# Patient Record
Sex: Male | Born: 1950
Health system: Southern US, Community
[De-identification: ages and names within clinical notes are randomized; demographics above are authoritative.]

## PROBLEM LIST (undated history)

## (undated) DIAGNOSIS — E119 Type 2 diabetes mellitus without complications: Secondary | ICD-10-CM

## (undated) DIAGNOSIS — I10 Essential (primary) hypertension: Secondary | ICD-10-CM

## (undated) DIAGNOSIS — I471 Supraventricular tachycardia, unspecified: Secondary | ICD-10-CM

## (undated) HISTORY — PX: NO PAST SURGERIES: SHX2092

## (undated) HISTORY — DX: Supraventricular tachycardia, unspecified: I47.10

## (undated) HISTORY — DX: Supraventricular tachycardia: I47.1

---

## 2009-12-19 ENCOUNTER — Emergency Department (HOSPITAL_COMMUNITY)
Admission: EM | Admit: 2009-12-19 | Discharge: 2009-12-19 | Payer: Self-pay | Source: Home / Self Care | Admitting: Family Medicine

## 2015-04-19 MED FILL — PIOGLITAZONE HCL 30 MG TAB: 30 | 90 days supply | Qty: 90 | Fill #1

## 2015-04-19 MED FILL — LOVASTATIN 40 MG TABLET: 40 | 90 days supply | Qty: 90 | Fill #1

## 2015-04-23 MED FILL — CARVEDILOL 12.5 MG TABLET: 12.5 | 90 days supply | Qty: 180 | Fill #1

## 2015-05-15 DIAGNOSIS — E669 Obesity, unspecified: Secondary | ICD-10-CM | POA: Diagnosis not present

## 2015-05-15 DIAGNOSIS — I10 Essential (primary) hypertension: Secondary | ICD-10-CM | POA: Diagnosis not present

## 2015-05-15 DIAGNOSIS — E782 Mixed hyperlipidemia: Secondary | ICD-10-CM | POA: Diagnosis not present

## 2015-05-15 DIAGNOSIS — Z6838 Body mass index (BMI) 38.0-38.9, adult: Secondary | ICD-10-CM | POA: Diagnosis not present

## 2015-05-15 DIAGNOSIS — E119 Type 2 diabetes mellitus without complications: Secondary | ICD-10-CM | POA: Diagnosis not present

## 2015-05-15 DIAGNOSIS — Z1211 Encounter for screening for malignant neoplasm of colon: Secondary | ICD-10-CM | POA: Diagnosis not present

## 2015-05-16 MED FILL — PIOGLITAZONE HCL 45 MG TAB: 45 | 90 days supply | Qty: 90 | Fill #0

## 2015-06-06 MED FILL — BENAZEPRIL-HCTZ 20-25 MG TA: 20-25 | 90 days supply | Qty: 90 | Fill #2

## 2015-06-13 MED FILL — JANUMET 50-1,000 MG TABLET: 50-1000 | 90 days supply | Qty: 180 | Fill #2

## 2015-07-23 MED FILL — LOVASTATIN 40 MG TABLET: 40 | 90 days supply | Qty: 90 | Fill #0

## 2015-07-23 MED FILL — CARVEDILOL 12.5 MG TABLET: 12.5 | 90 days supply | Qty: 180 | Fill #0

## 2015-09-04 MED FILL — BENAZEPRIL-HCTZ 20-25 MG TA: 20-25 | 90 days supply | Qty: 90 | Fill #0

## 2015-09-13 MED FILL — JANUMET 50-1,000 MG TABLET: 50-1000 | 90 days supply | Qty: 180 | Fill #0

## 2015-10-10 MED FILL — PIOGLITAZONE HCL 45 MG TAB: 45 | 90 days supply | Qty: 90 | Fill #0

## 2015-10-22 MED FILL — CARVEDILOL 12.5 MG TABLET: 12.5 | 90 days supply | Qty: 180 | Fill #1

## 2015-10-22 MED FILL — LOVASTATIN 40 MG TABLET: 40 | 90 days supply | Qty: 90 | Fill #1

## 2015-11-15 DIAGNOSIS — E1165 Type 2 diabetes mellitus with hyperglycemia: Secondary | ICD-10-CM | POA: Diagnosis not present

## 2015-11-15 DIAGNOSIS — I10 Essential (primary) hypertension: Secondary | ICD-10-CM | POA: Diagnosis not present

## 2015-11-15 DIAGNOSIS — L309 Dermatitis, unspecified: Secondary | ICD-10-CM | POA: Diagnosis not present

## 2015-11-15 DIAGNOSIS — E782 Mixed hyperlipidemia: Secondary | ICD-10-CM | POA: Diagnosis not present

## 2015-11-15 MED FILL — TRIAMCINOLONE 0.1% CREAM: 0.1 | 10 days supply | Qty: 30 | Fill #0

## 2015-11-23 MED FILL — GLIMEPIRIDE 2 MG TABLET: 2 | 30 days supply | Qty: 60 | Fill #0

## 2015-12-04 MED FILL — BENAZEPRIL-HCTZ 20-25 MG TA: 20-25 | 90 days supply | Qty: 90 | Fill #1

## 2015-12-12 MED FILL — JANUMET 50-1,000 MG TABLET: 50-1000 | 90 days supply | Qty: 180 | Fill #1

## 2015-12-17 MED FILL — GLIMEPIRIDE 2 MG TABLET: 2 | 30 days supply | Qty: 120 | Fill #0

## 2016-01-16 MED FILL — LOVASTATIN 40 MG TABLET: 40 | 90 days supply | Qty: 90 | Fill #0

## 2016-01-16 MED FILL — GLIMEPIRIDE 2 MG TABLET: 2 | 30 days supply | Qty: 120 | Fill #1

## 2016-01-16 MED FILL — CARVEDILOL 12.5 MG TABLET: 12.5 | 90 days supply | Qty: 180 | Fill #0

## 2016-02-14 MED FILL — GLIMEPIRIDE 2 MG TABLET: 2 | 30 days supply | Qty: 120 | Fill #0

## 2016-02-26 MED FILL — BENAZEPRIL-HCTZ 20-25 MG TA: 20-25 | 90 days supply | Qty: 90 | Fill #0

## 2016-02-26 MED FILL — TRIAMCINOLONE 0.1% CREAM: 0.1 | 10 days supply | Qty: 30 | Fill #1

## 2016-03-07 DIAGNOSIS — I10 Essential (primary) hypertension: Secondary | ICD-10-CM | POA: Diagnosis not present

## 2016-03-07 DIAGNOSIS — E782 Mixed hyperlipidemia: Secondary | ICD-10-CM | POA: Diagnosis not present

## 2016-03-07 DIAGNOSIS — E119 Type 2 diabetes mellitus without complications: Secondary | ICD-10-CM | POA: Diagnosis not present

## 2016-03-07 MED FILL — PIOGLITAZONE HCL 15 MG TAB: 15 | 90 days supply | Qty: 90 | Fill #0

## 2016-03-07 MED FILL — JANUMET 50-1,000 MG TABLET: 50-1000 | 90 days supply | Qty: 180 | Fill #0

## 2016-03-07 MED FILL — GLIMEPIRIDE 2 MG TABLET: 2 | 90 days supply | Qty: 360 | Fill #0

## 2016-04-21 MED FILL — LOVASTATIN 40 MG TABLET: 40 | 90 days supply | Qty: 90 | Fill #0

## 2016-04-21 MED FILL — CARVEDILOL 12.5 MG TABLET: 12.5 | 90 days supply | Qty: 180 | Fill #0

## 2016-05-25 ENCOUNTER — Observation Stay (HOSPITAL_COMMUNITY): Payer: 59

## 2016-05-25 ENCOUNTER — Encounter (HOSPITAL_COMMUNITY): Payer: Self-pay | Admitting: Family Medicine

## 2016-05-25 ENCOUNTER — Emergency Department (HOSPITAL_COMMUNITY): Payer: 59

## 2016-05-25 ENCOUNTER — Observation Stay (HOSPITAL_COMMUNITY)
Admission: EM | Admit: 2016-05-25 | Discharge: 2016-05-27 | Disposition: A | Discharge status: Home / Self Care | Payer: 59 | Attending: Internal Medicine | Admitting: Internal Medicine

## 2016-05-25 DIAGNOSIS — E119 Type 2 diabetes mellitus without complications: Secondary | ICD-10-CM | POA: Diagnosis not present

## 2016-05-25 DIAGNOSIS — R058 Other specified cough: Secondary | ICD-10-CM | POA: Diagnosis present

## 2016-05-25 DIAGNOSIS — Z6837 Body mass index (BMI) 37.0-37.9, adult: Secondary | ICD-10-CM | POA: Diagnosis not present

## 2016-05-25 DIAGNOSIS — E785 Hyperlipidemia, unspecified: Secondary | ICD-10-CM | POA: Insufficient documentation

## 2016-05-25 DIAGNOSIS — I471 Supraventricular tachycardia: Secondary | ICD-10-CM | POA: Insufficient documentation

## 2016-05-25 DIAGNOSIS — R748 Abnormal levels of other serum enzymes: Secondary | ICD-10-CM | POA: Diagnosis not present

## 2016-05-25 DIAGNOSIS — I472 Ventricular tachycardia: Secondary | ICD-10-CM | POA: Diagnosis not present

## 2016-05-25 DIAGNOSIS — I214 Non-ST elevation (NSTEMI) myocardial infarction: Principal | ICD-10-CM

## 2016-05-25 DIAGNOSIS — I480 Paroxysmal atrial fibrillation: Secondary | ICD-10-CM | POA: Diagnosis not present

## 2016-05-25 DIAGNOSIS — Z7984 Long term (current) use of oral hypoglycemic drugs: Secondary | ICD-10-CM | POA: Insufficient documentation

## 2016-05-25 DIAGNOSIS — E1165 Type 2 diabetes mellitus with hyperglycemia: Secondary | ICD-10-CM

## 2016-05-25 DIAGNOSIS — N2889 Other specified disorders of kidney and ureter: Secondary | ICD-10-CM | POA: Diagnosis not present

## 2016-05-25 DIAGNOSIS — I10 Essential (primary) hypertension: Secondary | ICD-10-CM | POA: Diagnosis present

## 2016-05-25 DIAGNOSIS — R05 Cough: Secondary | ICD-10-CM | POA: Insufficient documentation

## 2016-05-25 DIAGNOSIS — I25110 Atherosclerotic heart disease of native coronary artery with unstable angina pectoris: Secondary | ICD-10-CM | POA: Diagnosis not present

## 2016-05-25 DIAGNOSIS — I499 Cardiac arrhythmia, unspecified: Secondary | ICD-10-CM | POA: Diagnosis not present

## 2016-05-25 DIAGNOSIS — I700 Atherosclerosis of aorta: Secondary | ICD-10-CM | POA: Insufficient documentation

## 2016-05-25 DIAGNOSIS — E669 Obesity, unspecified: Secondary | ICD-10-CM | POA: Insufficient documentation

## 2016-05-25 DIAGNOSIS — R0602 Shortness of breath: Secondary | ICD-10-CM | POA: Diagnosis not present

## 2016-05-25 HISTORY — DX: Essential (primary) hypertension: I10

## 2016-05-25 HISTORY — DX: Type 2 diabetes mellitus without complications: E11.9

## 2016-05-25 LAB — CBC
HCT: 43.5 % (ref 39.0–52.0)
HEMATOCRIT: 48.1 % (ref 39.0–52.0)
HEMOGLOBIN: 16.5 g/dL (ref 13.0–17.0)
Hemoglobin: 15.3 g/dL (ref 13.0–17.0)
MCH: 31.4 pg (ref 26.0–34.0)
MCH: 31.9 pg (ref 26.0–34.0)
MCHC: 34.3 g/dL (ref 30.0–36.0)
MCHC: 35.2 g/dL (ref 30.0–36.0)
MCV: 91.6 fL (ref 78.0–100.0)
MCV: 90.8 fL (ref 78.0–100.0)
PLATELETS: 117 10*3/uL — AB (ref 150–400)
Platelets: 190 10*3/uL (ref 150–400)
RBC: 5.25 MIL/uL (ref 4.22–5.81)
RBC: 4.79 MIL/uL (ref 4.22–5.81)
RDW: 13.6 % (ref 11.5–15.5)
RDW: 13.7 % (ref 11.5–15.5)
WBC: 14.4 10*3/uL — ABNORMAL HIGH (ref 4.0–10.5)
WBC: 6.9 10*3/uL (ref 4.0–10.5)

## 2016-05-25 LAB — TROPONIN I
TROPONIN I: 0.86 ng/mL — AB (ref <0.03)
TROPONIN I: 4.52 ng/mL — AB (ref <0.03)
Troponin I: 2.64 ng/mL (ref <0.03)

## 2016-05-25 LAB — EXPECTORATED SPUTUM ASSESSMENT W REFEX TO RESP CULTURE

## 2016-05-25 LAB — BASIC METABOLIC PANEL
ANION GAP: 10 (ref 5–15)
BUN: 22 mg/dL — ABNORMAL HIGH (ref 6–20)
CHLORIDE: 103 mmol/L (ref 101–111)
CO2: 24 mmol/L (ref 22–32)
Calcium: 10.1 mg/dL (ref 8.9–10.3)
Creatinine, Ser: 1.29 mg/dL — ABNORMAL HIGH (ref 0.61–1.24)
GFR calc non Af Amer: 57 mL/min — ABNORMAL LOW (ref >60)
GLUCOSE: 277 mg/dL — AB (ref 65–99)
Potassium: 4.2 mmol/L (ref 3.5–5.1)
Sodium: 137 mmol/L (ref 135–145)

## 2016-05-25 LAB — PROTIME-INR
INR: 1.03
Prothrombin Time: 13.5 seconds (ref 11.4–15.2)

## 2016-05-25 LAB — GLUCOSE, CAPILLARY
Glucose-Capillary: 208 mg/dL — ABNORMAL HIGH (ref 65–99)
Glucose-Capillary: 161 mg/dL — ABNORMAL HIGH (ref 65–99)
Glucose-Capillary: 102 mg/dL — ABNORMAL HIGH (ref 65–99)

## 2016-05-25 LAB — CREATININE, SERUM
CREATININE: 1.14 mg/dL (ref 0.61–1.24)
GFR calc Af Amer: >60 mL/min (ref >60)

## 2016-05-25 LAB — I-STAT TROPONIN, ED: TROPONIN I, POC: 0 ng/mL (ref 0.00–0.08)

## 2016-05-25 LAB — PHOSPHORUS: Phosphorus: 3 mg/dL (ref 2.5–4.6)

## 2016-05-25 LAB — HEPARIN LEVEL (UNFRACTIONATED): Heparin Unfractionated: 0.22 IU/mL — ABNORMAL LOW (ref 0.30–0.70)

## 2016-05-25 LAB — MAGNESIUM: Magnesium: 1.7 mg/dL (ref 1.7–2.4)

## 2016-05-25 LAB — EXPECTORATED SPUTUM ASSESSMENT W GRAM STAIN, RFLX TO RESP C: Special Requests: NORMAL

## 2016-05-25 MED ORDER — METOPROLOL TARTRATE 5 MG/5ML IV SOLN
5.0000 mg | Freq: Once | INTRAVENOUS | Status: AC
  Administered 2016-05-25: 5 mg via INTRAVENOUS
  Filled 2016-05-25: qty 5

## 2016-05-25 MED ORDER — ASPIRIN EC 325 MG PO TBEC
325.0000 mg | DELAYED_RELEASE_TABLET | Freq: Every day | ORAL | Status: DC
  Administered 2016-05-25: 325 mg via ORAL
  Filled 2016-05-25: qty 1

## 2016-05-25 MED ORDER — DEXTROSE 5 % IV SOLN
1.0000 g | INTRAVENOUS | Status: DC
  Administered 2016-05-25 – 2016-05-27 (×3): 1 g via INTRAVENOUS
  Filled 2016-05-25 (×3): qty 10

## 2016-05-25 MED ORDER — ZOLPIDEM TARTRATE 5 MG PO TABS: 5.0000 mg | ORAL_TABLET | Freq: Every evening | ORAL | Status: DC | PRN

## 2016-05-25 MED ORDER — SODIUM CHLORIDE 0.9 % IV BOLUS (SEPSIS)
1000.0000 mL | Freq: Once | INTRAVENOUS | Status: AC
  Administered 2016-05-25: 1000 mL via INTRAVENOUS

## 2016-05-25 MED ORDER — HEPARIN SODIUM (PORCINE) 5000 UNIT/ML IJ SOLN: 5000.0000 [IU] | Freq: Three times a day (TID) | INTRAMUSCULAR | Status: DC

## 2016-05-25 MED ORDER — HEPARIN (PORCINE) IN NACL 100-0.45 UNIT/ML-% IJ SOLN
1550.0000 [IU]/h | INTRAMUSCULAR | Status: DC
  Administered 2016-05-25: 1350 [IU]/h via INTRAVENOUS
  Administered 2016-05-26: 1550 [IU]/h via INTRAVENOUS
  Filled 2016-05-25 (×2): qty 250

## 2016-05-25 MED ORDER — NITROGLYCERIN 0.4 MG SL SUBL: 0.4000 mg | SUBLINGUAL_TABLET | SUBLINGUAL | Status: DC | PRN

## 2016-05-25 MED ORDER — INSULIN ASPART 100 UNIT/ML ~~LOC~~ SOLN
0.0000 [IU] | Freq: Three times a day (TID) | SUBCUTANEOUS | Status: DC
  Administered 2016-05-25: 7 [IU] via SUBCUTANEOUS
  Administered 2016-05-25 – 2016-05-27 (×6): 4 [IU] via SUBCUTANEOUS

## 2016-05-25 MED ORDER — ADENOSINE 6 MG/2ML IV SOLN
INTRAVENOUS | Status: AC
  Administered 2016-05-25: 12 mg
  Filled 2016-05-25: qty 4

## 2016-05-25 MED ORDER — DILTIAZEM HCL 25 MG/5ML IV SOLN
15.0000 mg | Freq: Once | INTRAVENOUS | Status: DC | PRN
  Filled 2016-05-25: qty 5

## 2016-05-25 MED ORDER — HEPARIN BOLUS VIA INFUSION
4000.0000 [IU] | Freq: Once | INTRAVENOUS | Status: AC
  Administered 2016-05-25: 4000 [IU] via INTRAVENOUS
  Filled 2016-05-25: qty 4000

## 2016-05-25 MED ORDER — ALPRAZOLAM 0.25 MG PO TABS: 0.2500 mg | ORAL_TABLET | Freq: Two times a day (BID) | ORAL | Status: DC | PRN

## 2016-05-25 MED ORDER — MORPHINE SULFATE (PF) 4 MG/ML IV SOLN: 2.0000 mg | INTRAVENOUS | Status: DC | PRN

## 2016-05-25 MED ORDER — ONDANSETRON HCL 4 MG/2ML IJ SOLN: 4.0000 mg | Freq: Four times a day (QID) | INTRAMUSCULAR | Status: DC | PRN

## 2016-05-25 MED ORDER — HYDRALAZINE HCL 20 MG/ML IJ SOLN: 10.0000 mg | Freq: Three times a day (TID) | INTRAMUSCULAR | Status: DC | PRN

## 2016-05-25 MED ORDER — ACETAMINOPHEN 325 MG PO TABS: 650.0000 mg | ORAL_TABLET | ORAL | Status: DC | PRN

## 2016-05-25 MED ORDER — GUAIFENESIN ER 600 MG PO TB12
600.0000 mg | ORAL_TABLET | Freq: Two times a day (BID) | ORAL | Status: DC
  Administered 2016-05-25 – 2016-05-27 (×4): 600 mg via ORAL
  Filled 2016-05-25 (×4): qty 1

## 2016-05-25 MED ORDER — IPRATROPIUM BROMIDE 0.02 % IN SOLN
0.5000 mg | Freq: Four times a day (QID) | RESPIRATORY_TRACT | Status: DC
  Administered 2016-05-25 – 2016-05-26 (×4): 0.5 mg via RESPIRATORY_TRACT
  Filled 2016-05-25 (×4): qty 2.5

## 2016-05-25 NOTE — Progress Notes (Signed)
ANTICOAGULATION CONSULT NOTE - Follow Up Consult  Pharmacy Consult for Heparin Indication: chest pain/ACS  Patient Measurements: Height: 5\' 9"  (175.3 cm) Weight: 256 lb 12.8 oz (116.5 kg) IBW/kg (Calculated) : 70.7 Heparin Dosing Weight: 96.8 kg  Vital Signs: Temp: 97.4 F (36.3 C) (03/18 1656) Temp Source: Oral (03/18 1656) BP: 134/79 (03/18 1656) Pulse Rate: 78 (03/18 1656)  Labs:  Recent Labs  05/25/16 0815 05/25/16 1053 05/25/16 1306 05/25/16 1617 05/25/16 1853  HGB 16.5 15.3  --   --   --   HCT 48.1 43.5  --   --   --   PLT 190 117*  --   --   --   LABPROT 13.5  --   --   --   --   INR 1.03  --   --   --   --   HEPARINUNFRC  --   --   --   --  0.22*  CREATININE 1.29* 1.14  --   --   --   TROPONINI  --  0.86* 2.64* 4.52*  --     Estimated Creatinine Clearance: 83.5 mL/min (by C-G formula based on SCr of 1.14 mg/dL).   Assessment: 30 yof presents with tachycardia, sweating, elevated troponin. Pharmacy consulted to dose heparin for ACS. Not on anticoagulation PTA. Low probability for PE per MD notes.  Heparin level this evening resulted as SUBtherapeutic however was 2 hours early (HL 0.22, goal of 0.3-0.7). Will increase but not aggressively given early level. Hgb/Hct wnl, plts 117 - will watch.   Goal of Therapy:  Heparin level 0.3-0.7 units/ml Monitor platelets by anticoagulation protocol: Yes   Plan:  1. Increase Heparin to 1550 units/hr (15.5 ml/hr) 2. Will continue to monitor for any signs/symptoms of bleeding and will follow up with heparin level in 6 hours   Thank you for allowing pharmacy to be a part of this patient's care.  Alycia Rossetti, PharmD, BCPS Clinical Pharmacist Pager: [phone] 05/25/2016 7:33 PM

## 2016-05-25 NOTE — Progress Notes (Signed)
Notified MD of pt troponin level 4.52. Pt asymptomatic VSS. Will continue to monitor. Isac Caddy, RN

## 2016-05-25 NOTE — H&P (Signed)
[facility] History and Physical  Barry Alexander RSW:[id number] DOB: 1950/08/02 DOA: 05/25/2016  Referring physician:  PCP: Orpah Melter, MD   Chief Complaint: "All of a sudden my heart started racing and I started to poor sweat."  HPI: Barry Alexander is a 66 y.o. male  with past history of diabetes and hypertension presents emergency room with tachycardia and diaphoresis. Patient states that over last 3 weeks he's had a productive cough of white sputum. Hasn't taken any medication for this. Has not really improved. Over this time he also has some shortness of breath. Today he was working with no difficulties. He went up to get a piece of equipment and all of a sudden his heart began racing started to poor sweat. Patient works at [employer] and made his way to emergency room.  ED course: EKG confirmed wide complex tachycardia and patient was given adenosine. This was effective and patient's rate became regular. Cardiology was consulted by the EDP but they declined admission and asked the hospitalist to admit patient. Hospitalist consulted. Initial troponin negative.   Review of Systems:  As per HPI otherwise 10 point review of systems negative.    Past Medical History:  Diagnosis Date  . DM (diabetes mellitus) ([place])   . HTN (hypertension)    Past Surgical History:  Procedure Laterality Date  . NO PAST SURGERIES     Social History:  has no tobacco, alcohol, and drug history on file.  Not on File  Family History  Problem Relation Age of Onset  . Heart attack Neg Hx      Prior to Admission medications   Not on File   Physical Exam: Vitals:   05/25/16 0807 05/25/16 0815 05/25/16 0822  BP: (!) 133/113 (!) 184/165 (!) 134/91  Pulse: (!) 170 (!) 163 92  Resp: (!) 28 (!) 28 20  SpO2: 100% 100% 99%    Wt Readings from Last 3 Encounters:  No data found for Wt    General:  Appears calm and comfortable, Alert and oriented 3 Eyes:  PERRL, EOMI, normal  lids, iris ENT:  grossly normal hearing, lips & tongue Neck:  no LAD, masses or thyromegaly Cardiovascular:  RRR, no m/r/g. No LE edema.  Respiratory:  Mild rhonchi heard diffusely, labored breathing, tachypnea use of accessory muscles, breathing with pursed lips Abdomen:  soft, ntnd Skin:  no rash or induration seen on limited exam Musculoskeletal:  grossly normal tone BUE/BLE Psychiatric:  grossly normal mood and affect, speech fluent and appropriate Neurologic:  CN 2-12 grossly intact, moves all extremities in coordinated fashion.          Labs on Admission:  Basic Metabolic Panel:  Recent Labs Lab 05/25/16 0815  NA 137  K 4.2  CL 103  CO2 24  GLUCOSE 277*  BUN 22*  CREATININE 1.29*  CALCIUM 10.1   Liver Function Tests: No results for input(s): AST, ALT, ALKPHOS, BILITOT, PROT, ALBUMIN in the last 168 hours. No results for input(s): LIPASE, AMYLASE in the last 168 hours. No results for input(s): AMMONIA in the last 168 hours. CBC:  Recent Labs Lab 05/25/16 0815  WBC 14.4*  HGB 16.5  HCT 48.1  MCV 91.6  PLT 190   Cardiac Enzymes: No results for input(s): CKTOTAL, CKMB, CKMBINDEX, TROPONINI in the last 168 hours.  BNP (last 3 results) No results for input(s): BNP in the last 8760 hours.  ProBNP (last 3 results) No results for input(s): PROBNP in the last 8760  hours.   Creatinine clearance cannot be calculated (Unknown ideal weight.)  CBG: No results for input(s): GLUCAP in the last 168 hours.  Radiological Exams on Admission: Dg Chest Portable 1 View  Result Date: 05/25/2016 CLINICAL DATA:  66 year old male with central and left-sided chest pain accompanied by weakness and shortness of breath. EXAM: PORTABLE CHEST 1 VIEW COMPARISON:  None FINDINGS: The lungs are clear and negative for focal airspace consolidation, pulmonary edema or suspicious pulmonary nodule. No pleural effusion or pneumothorax. Cardiac and mediastinal contours are within normal limits.  No acute fracture or lytic or blastic osseous lesions. The visualized upper abdominal bowel gas pattern is unremarkable. IMPRESSION: Negative chest x-ray. Electronically Signed   By: Jacqulynn Cadet M.D.   On: 05/25/2016 08:35    EKG: Independently reviewed. Wide Tachycardia.   Assessment/Plan Principal Problem:   Arrhythmia Active Problems:   DM II (diabetes mellitus, type II), controlled (HCC)   HTN (hypertension), benign   Productive cough  Arrhythmia Resolved after adenosine in the emergency room EDP reports disordered cardiology who requested to do consultation only with hospitalists as primary Post adenosine EKG ordered Checking magnesium phosphorus Serial troponin Echo ordered Well's score for PE 1.5 low risk  Productive cough Due to 3 weeks duration and patient's dyspnea bedside which he says is been going on for the same time Checking chest CT Started on empiric Rocephin Sputum cult ordered  DM SSI ACHS Check A1c  Hypertension When necessary hydralazine 10 mg IV as needed for severe blood pressure  Awaiting med rec  Code Status: FULL DVT Prophylaxis: Heparin Family Communication: pt want to discuss with wife himself Disposition Plan: Pending Improvement  Status: tele obs  Elwin Mocha, MD Family Medicine [facility] [email or web address] Password [email or web address]

## 2016-05-25 NOTE — ED Provider Notes (Signed)
[facility] DEPT Provider Note   CSN: [id number] Arrival date & time: 05/25/16  0800     History   Chief Complaint Chief Complaint  Patient presents with  . Chest Pain    HPI Barry Alexander is a 66 y.o. male.  HPI Patient presents to the emergency department with sudden onset of chest discomfort, jaw discomfort, nausea and diaphoresis with heart racing.  The patient states that it started when he woke up this morning.  He  states he has not had any episodes like this in the past. The patient denies headache,blurred vision, neck pain, fever, cough, weakness, numbness, dizziness, anorexia, edema, abdominal pain,  vomiting, diarrhea, rash, back pain, dysuria, hematemesis, bloody stool, near syncope, or syncope. No past medical history on file.  Patient Active Problem List   Diagnosis Date Noted  . Arrhythmia 05/25/2016    No past surgical history on file.     Home Medications    Prior to Admission medications   Not on File    Family History No family history on file.  Social History Social History  Substance Use Topics  . Smoking status: Not on file  . Smokeless tobacco: Not on file  . Alcohol use Not on file     Allergies   Patient has no allergy information on record.   Review of Systems Review of Systems  All other systems negative except as documented in the HPI. All pertinent positives and negatives as reviewed in the HPI. Physical Exam Updated Vital Signs BP (!) 134/91 (BP Location: Right Arm)   Pulse 92   Resp 20   SpO2 99%   Physical Exam  Constitutional: He is oriented to person, place, and time. He appears well-developed and well-nourished. No distress.  HENT:  Head: Normocephalic and atraumatic.  Mouth/Throat: Oropharynx is clear and moist.  Eyes: Pupils are equal, round, and reactive to light.  Neck: Normal range of motion. Neck supple.  Cardiovascular: Regular rhythm and normal heart sounds.  Tachycardia present.  Exam reveals no  gallop and no friction rub.   No murmur heard. Pulmonary/Chest: Effort normal and breath sounds normal. No respiratory distress. He has no wheezes.  Neurological: He is alert and oriented to person, place, and time. He exhibits normal muscle tone. Coordination normal.  Skin: Skin is warm and dry. Capillary refill takes less than 2 seconds. No rash noted. No erythema.  Psychiatric: He has a normal mood and affect. His behavior is normal.  Nursing note and vitals reviewed.    ED Treatments / Results  Labs (all labs ordered are listed, but only abnormal results are displayed) Labs Reviewed  BASIC METABOLIC PANEL - Abnormal; Notable for the following:       Result Value   Glucose, Bld 277 (*)    BUN 22 (*)    Creatinine, Ser 1.29 (*)    GFR calc non Af Amer 57 (*)    All other components within normal limits  CBC - Abnormal; Notable for the following:    WBC 14.4 (*)    All other components within normal limits  PROTIME-INR  HIV ANTIBODY (ROUTINE TESTING)  TROPONIN I  TROPONIN I  TROPONIN I  CBC  CREATININE, SERUM  MAGNESIUM  PHOSPHORUS  CALCIUM, IONIZED  I-STAT TROPOININ, ED    EKG  EKG Interpretation None       Radiology Dg Chest Portable 1 View  Result Date: 05/25/2016 CLINICAL DATA:  66 year old male with central and left-sided chest pain accompanied by  weakness and shortness of breath. EXAM: PORTABLE CHEST 1 VIEW COMPARISON:  None FINDINGS: The lungs are clear and negative for focal airspace consolidation, pulmonary edema or suspicious pulmonary nodule. No pleural effusion or pneumothorax. Cardiac and mediastinal contours are within normal limits. No acute fracture or lytic or blastic osseous lesions. The visualized upper abdominal bowel gas pattern is unremarkable. IMPRESSION: Negative chest x-ray. Electronically Signed   By: Jacqulynn Cadet M.D.   On: 05/25/2016 08:35    Procedures Procedures (including critical care time)  Medications Ordered in  ED Medications  acetaminophen (TYLENOL) tablet 650 mg (not administered)  ondansetron (ZOFRAN) injection 4 mg (not administered)  heparin injection 5,000 Units (not administered)  morphine 4 MG/ML injection 2 mg (not administered)  aspirin EC tablet 325 mg (not administered)  zolpidem (AMBIEN) tablet 5 mg (not administered)  ALPRAZolam (XANAX) tablet 0.25 mg (not administered)  nitroGLYCERIN (NITROSTAT) SL tablet 0.4 mg (not administered)  hydrALAZINE (APRESOLINE) injection 10 mg (not administered)  adenosine (ADENOCARD) 6 MG/2ML injection (12 mg  Given 05/25/16 0814)  sodium chloride 0.9 % bolus 1,000 mL (1,000 mLs Intravenous New Bag/Given 05/25/16 1001)     Initial Impression / Assessment and Plan / ED Course  I have reviewed the triage vital signs and the nursing notes.  Pertinent labs & imaging results that were available during my care of the patient were reviewed by me and considered in my medical decision making (see chart for details).     I spoke with cardiology about the patient has asked me to speak with the hospitalist about admission.  They will consult.  Patient is advised plan and all questions were answered.   Final Clinical Impressions(s) / ED Diagnoses   Final diagnoses:  SVT (supraventricular tachycardia) [facility])    New Prescriptions New Prescriptions   No medications on file     Dalia Heading, PA-C 05/25/16 [place], MD 05/25/16 1220

## 2016-05-25 NOTE — ED Notes (Signed)
Pt transported to CT ?

## 2016-05-25 NOTE — ED Notes (Signed)
MD Aggie Moats made aware trop 0.86

## 2016-05-25 NOTE — Progress Notes (Signed)
[facility] for heparin Indication: chest pain/ACS  Heparin Dosing Weight: 96.8 kg   Assessment: 74 yof presents with tachycardia, sweating, elevated troponin. Pharmacy consulted to dose heparin for ACS. Not on anticoagulation PTA. Heparin for VTE prophylaxis ordered - not yet given. Hg wnl, plt 117. No bleed documented. Low probability for PE per MD notes.  Goal of Therapy:  Heparin level 0.3-0.7 units/ml Monitor platelets by anticoagulation protocol: Yes   Plan:  D/c heparin SQ Heparin 4000 unit bolus Start heparin at 1350 units/h 6h heparin level Daily heparin level/CBC Monitor s/sx bleeding   Elicia Lamp, PharmD, BCPS Clinical Pharmacist 05/25/2016 12:32 PM

## 2016-05-25 NOTE — Progress Notes (Signed)
Pharmacy Antibiotic Note  Barry Alexander is a 66 y.o. male admitted on 05/25/2016 with tachycardia and sweating. MD concerned for upper respiratory infection/bacterial bronchitis.  Pharmacy has been consulted for ceftriaxone dosing.  Patient has history of DM and HTN but no allergies that he is aware of. He is agreeable to receiving Ceftriaxone.   Plan: Ceftriaxone 1g IV every 24 hours.  No dose adjustment needed, pharmacy will sign off.      No data recorded.   Recent Labs Lab 05/25/16 0815  WBC 14.4*  CREATININE 1.29*    CrCl cannot be calculated (Unknown ideal weight.).    Not on File  Antimicrobials this admission: Ceftriaxone 3/18 >>  Dose adjustments this admission:  Microbiology results: 3/18 Sputum >>  Thank you for allowing pharmacy to be a part of this patient's care.  Sloan Leiter, PharmD, BCPS Clinical Pharmacist Clinical phone [phone] until 3:30 PM 05/25/2016  After hours, please call #[phone] 05/25/2016 11:09 AM

## 2016-05-25 NOTE — Consult Note (Signed)
CARDIOLOGY CONSULT NOTE   Patient ID: Barry Alexander MRN: [id number] DOB/AGE: 08/12/1950 66 y.o.  Admit date: 05/25/2016  Requesting Physician: Primary Physician:   Orpah Melter, MD Primary Cardiologist:   N/A Reason for Consultation:   NSTEMI, WCT  HPI: Barry Alexander is a 66 y.o. male with a history of NIDDM, HTN, dyslipidemia, and obesity who presented to the ED with palpitations and diaphoresis and was found to be in a wide-complex tachycardia.  He received 6mg  of IV adenosine with conversion to NSR, and has maintained NSR since.  He was subsequently admitted to the internal medicine service for observation, and is being management for CAP given several weeks of productive cough and leukocytosis on admission labs.  The cardiology service is now consulted given elevated troponin to 4.5 and evidence of severe multivessel coronary calcifications on CT chest.  Barry Alexander is currently resting comfortably in bed and has no acute complaints.  He denies SSCP during the period of tachycardia today, and reports that he can perform significant physical exertion including chopping wood without experiencing CP or dyspnea.  He has no known cardiac history and has never had a stress test, cardiac cath, or echo to the best of his knowledge.   Past Medical History:  Diagnosis Date  . DM (diabetes mellitus) ([place])   . HTN (hypertension)      Past Surgical History:  Procedure Laterality Date  . NO PAST SURGERIES      Allergies  Allergen Reactions  . Tobramycin     Made pinkeye flare up more    I have reviewed the patient's current medications . aspirin EC  325 mg Oral Daily  . cefTRIAXone (ROCEPHIN)  IV  1 g Intravenous Q24H  . guaiFENesin  600 mg Oral BID  . insulin aspart  0-20 Units Subcutaneous TID WC  . ipratropium  0.5 mg Nebulization QID   . heparin 1,550 Units/hr (05/25/16 1950)   acetaminophen, ALPRAZolam, diltiazem, hydrALAZINE, morphine injection, nitroGLYCERIN,  ondansetron (ZOFRAN) IV, zolpidem  Prior to Admission medications   Medication Sig Start Date End Date Taking? Authorizing Provider  benazepril-hydrochlorthiazide (LOTENSIN HCT) 20-25 MG tablet Take 1 tablet by mouth daily. 02/26/16  Yes Historical Provider, MD  carvedilol (COREG) 12.5 MG tablet Take 12.5 mg by mouth 2 (two) times daily. 04/21/16  Yes Historical Provider, MD  glimepiride (AMARYL) 2 MG tablet Take 2 mg by mouth 2 (two) times daily. 03/07/16  Yes Historical Provider, MD  JANUMET 50-1000 MG tablet Take 1 tablet by mouth 2 (two) times daily. 03/07/16  Yes Historical Provider, MD  lovastatin (MEVACOR) 40 MG tablet Take 40 mg by mouth every evening. 04/21/16  Yes Historical Provider, MD  Multiple Vitamin (MULTIVITAMIN WITH MINERALS) TABS tablet Take 1 tablet by mouth daily.   Yes Historical Provider, MD  pioglitazone (ACTOS) 15 MG tablet Take 15 mg by mouth daily. 03/07/16  Yes Historical Provider, MD  triamcinolone cream (KENALOG) 0.1 % Apply 1 application topically daily as needed. 02/26/16  Yes Historical Provider, MD     Social History   Social History  . Marital status: Married    Spouse name: N/A  . Number of children: N/A  . Years of education: N/A   Occupational History  . Not on file.   Social History Main Topics  . Smoking status: Not on file  . Smokeless tobacco: Not on file  . Alcohol use Not on file  . Drug use: Unknown  . Sexual activity:  Not on file   Other Topics Concern  . Not on file   Social History Narrative  . No narrative on file    Family Status  Relation Status  . Neg Hx    Family History  Problem Relation Age of Onset  . Heart attack Neg Hx     ROS:  Full 14 point review of systems complete and found to be negative unless listed above.  Physical Exam: Blood pressure 134/79, pulse 78, temperature 97.4 F (36.3 C), temperature source Oral, resp. rate 20, height 5\' 9"  (1.753 m), weight 116.5 kg (256 lb 12.8 oz), SpO2 97 %.  General:  Well developed, well nourished, male in no acute distress Head: Eyes PERRLA, No xanthomas.   Normocephalic and atraumatic, oropharynx without edema or exudate. Dentition:  Lungs: CTAB, no w/r/c Heart: RRR, +S1 +S2, no appreciable m/r/g, no JVD, no LE edeam   Neck: No carotid bruits. No lymphadenopathy.  JVD. Abdomen: Bowel sounds present, abdomen soft and non-tender without masses or hernias noted. Msk:  No spine or cva tenderness. No weakness, no joint deformities or effusions. Extremities: No clubbing or cyanosis.  edema.  Neuro: Alert and oriented X 3. No focal deficits noted. Psych:  Good affect, responds appropriately Skin: No rashes or lesions noted.  Labs:   Lab Results  Component Value Date   WBC 6.9 05/25/2016   HGB 15.3 05/25/2016   HCT 43.5 05/25/2016   MCV 90.8 05/25/2016   PLT 117 (L) 05/25/2016    Recent Labs  05/25/16 0815  INR 1.03    Recent Labs Lab 05/25/16 0815 05/25/16 1053  NA 137  --   K 4.2  --   CL 103  --   CO2 24  --   BUN 22*  --   CREATININE 1.29* 1.14  CALCIUM 10.1  --   GLUCOSE 277*  --    Magnesium  Date Value Ref Range Status  05/25/2016 1.7 1.7 - 2.4 mg/dL Final    Recent Labs  05/25/16 1053 05/25/16 1306 05/25/16 1617  TROPONINI 0.86* 2.64* 4.52*    Recent Labs  05/25/16 0821  TROPIPOC 0.00   No results found for: PROBNP No results found for: CHOL, HDL, LDLCALC, TRIG No results found for: DDIMER No results found for: LIPASE, AMYLASE No results found for: TSH, T4TOTAL, T3FREE, THYROIDAB No results found for: VITAMINB12, FOLATE, FERRITIN, TIBC, IRON, RETICCTPCT  Echo: pending  ECG:  Per my review, initial tracing from this morning shows a 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  Subsequent tracings show NSR with persistent RBBB and no pathologic Qwaves or ST/Twave changes indicative of ischemia  Radiology:  Ct Chest Wo Contrast  Result Date: 05/25/2016 CLINICAL  DATA:  Productive cough for 3 weeks.  Atrial fibrillation. EXAM: CT CHEST WITHOUT CONTRAST TECHNIQUE: Multidetector CT imaging of the chest was performed following the standard protocol without IV contrast. COMPARISON:  Chest radiograph 05/25/2016 FINDINGS: Cardiovascular: Normal contour of the great vessels. Advanced calcific atherosclerotic disease of the coronary arteries. Milder atherosclerotic disease and tortuosity of the aorta. The heart is normal in size. There is no pericardial effusion. Mediastinum/Nodes: No enlarged mediastinal or axillary lymph nodes. Thyroid gland, trachea, and esophagus demonstrate no significant findings. Lungs/Pleura: Lungs are clear. No pleural effusion or pneumothorax. Upper Abdomen: No acute abnormality. 6 cm water density lobulated mass in the left lobe of the liver likely represents a cyst. There is a 2.1 cm circumscribed exophytic mass off of  the midpole lateral left renal cortex, which does not satisfy the criteria for a simple cyst by density. 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 IMPRESSION: Advanced calcific atherosclerotic disease of the coronary arteries. Mild atherosclerotic disease of the aorta. No evidence of pulmonary consolidation. 2.1 cm left renal mass, indeterminate by a nonenhanced CT criteria. Further evaluation with renal ultrasound or contrast-enhanced abdominal CT may be considered if found clinically necessary. Electronically Signed   By: Fidela Salisbury M.D.   On: 05/25/2016 13:09   Dg Chest Portable 1 View  Result Date: 05/25/2016 CLINICAL DATA:  66 year old male with central and left-sided chest pain accompanied by weakness and shortness of breath. EXAM: PORTABLE CHEST 1 VIEW COMPARISON:  None FINDINGS: The lungs are clear and negative for focal airspace consolidation, pulmonary edema or suspicious pulmonary nodule. No pleural effusion or pneumothorax. Cardiac and mediastinal contours are within normal limits. No  acute fracture or lytic or blastic osseous lesions. The visualized upper abdominal bowel gas pattern is unremarkable. IMPRESSION: Negative chest x-ray. Electronically Signed   By: Jacqulynn Cadet M.D.   On: 05/25/2016 08:35    ASSESSMENT AND PLAN:    Principal Problem:   AF (paroxysmal atrial fibrillation) (HCC) Active Problems:   DM II (diabetes mellitus, type II), controlled ([place])   HTN (hypertension), benign   Productive cough  The pt is a 66 y.o. male with PMH significant for NIDDM, HTN, dyslipidemia, and obesity who presented to the ED with palpitations and diaphoresis and was found to be in a wide-complex tachycardia that converted to NSR with adenosine.  Initial ECG and resolution of tachycardia with adenosine suggests AVNRT, and pt has maintained NSR since.  He was CP free during the event, and performs significant degrees of physical activity without ischemic symptoms, but carries clear coronary risk factors and has evidence of multivessel coronary calcifications on CT chest.  Elevated troponin most likely represents demand ischemia in the setting of prolonged tachycardia, however given significant troponin elevation invasive diagnostic investigation with coronary angiography may be warranted.  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  Outside of management for ACS the pt's DM is a direct indication for high-intensity statin therapy - f/u lipid panel and A1C  - trend troponin until peak - NPO for possible cath in am  # Dyslipidemia - suggest transitioning statin therapy as per above  #  NIDDM; management as per primary team  # CAP; no evidence of consolidation on CT chest, pt denies fevers, management per primary team  Signed: Clayborne Dana, MD 05/25/2016 10:03 PM

## 2016-05-25 NOTE — ED Notes (Signed)
Pt being transported to CT then 2W.

## 2016-05-25 NOTE — ED Notes (Signed)
Attempted report 

## 2016-05-26 ENCOUNTER — Encounter (HOSPITAL_COMMUNITY)
Admission: EM | Disposition: A | Discharge status: Home / Self Care | Payer: Self-pay | Source: Home / Self Care | Attending: Emergency Medicine

## 2016-05-26 ENCOUNTER — Encounter (HOSPITAL_COMMUNITY): Payer: Self-pay | Admitting: Cardiovascular Disease

## 2016-05-26 DIAGNOSIS — Z7984 Long term (current) use of oral hypoglycemic drugs: Secondary | ICD-10-CM | POA: Diagnosis not present

## 2016-05-26 DIAGNOSIS — I480 Paroxysmal atrial fibrillation: Secondary | ICD-10-CM | POA: Diagnosis not present

## 2016-05-26 DIAGNOSIS — R05 Cough: Secondary | ICD-10-CM | POA: Diagnosis not present

## 2016-05-26 DIAGNOSIS — I25110 Atherosclerotic heart disease of native coronary artery with unstable angina pectoris: Secondary | ICD-10-CM | POA: Diagnosis not present

## 2016-05-26 DIAGNOSIS — E1165 Type 2 diabetes mellitus with hyperglycemia: Secondary | ICD-10-CM | POA: Diagnosis not present

## 2016-05-26 DIAGNOSIS — I471 Supraventricular tachycardia: Secondary | ICD-10-CM | POA: Diagnosis not present

## 2016-05-26 DIAGNOSIS — I10 Essential (primary) hypertension: Secondary | ICD-10-CM

## 2016-05-26 DIAGNOSIS — I214 Non-ST elevation (NSTEMI) myocardial infarction: Secondary | ICD-10-CM | POA: Diagnosis not present

## 2016-05-26 DIAGNOSIS — E785 Hyperlipidemia, unspecified: Secondary | ICD-10-CM | POA: Diagnosis not present

## 2016-05-26 DIAGNOSIS — N2889 Other specified disorders of kidney and ureter: Secondary | ICD-10-CM | POA: Diagnosis not present

## 2016-05-26 DIAGNOSIS — E119 Type 2 diabetes mellitus without complications: Secondary | ICD-10-CM | POA: Diagnosis not present

## 2016-05-26 HISTORY — PX: LEFT HEART CATH AND CORONARY ANGIOGRAPHY: CATH118249

## 2016-05-26 LAB — CBC
HCT: 40.4 % (ref 39.0–52.0)
Hemoglobin: 14.2 g/dL (ref 13.0–17.0)
MCH: 31.6 pg (ref 26.0–34.0)
MCHC: 35.1 g/dL (ref 30.0–36.0)
MCV: 90 fL (ref 78.0–100.0)
PLATELETS: 101 10*3/uL — AB (ref 150–400)
RBC: 4.49 MIL/uL (ref 4.22–5.81)
RDW: 13.7 % (ref 11.5–15.5)
WBC: 7.9 10*3/uL (ref 4.0–10.5)

## 2016-05-26 LAB — BASIC METABOLIC PANEL
Anion gap: 13 (ref 5–15)
BUN: 20 mg/dL (ref 6–20)
CALCIUM: 9 mg/dL (ref 8.9–10.3)
CO2: 26 mmol/L (ref 22–32)
CREATININE: 1 mg/dL (ref 0.61–1.24)
Chloride: 100 mmol/L — ABNORMAL LOW (ref 101–111)
Glucose, Bld: 121 mg/dL — ABNORMAL HIGH (ref 65–99)
Potassium: 3.5 mmol/L (ref 3.5–5.1)
SODIUM: 139 mmol/L (ref 135–145)

## 2016-05-26 LAB — HEPARIN LEVEL (UNFRACTIONATED)
HEPARIN UNFRACTIONATED: 0.42 [IU]/mL (ref 0.30–0.70)
HEPARIN UNFRACTIONATED: 0.47 [IU]/mL (ref 0.30–0.70)

## 2016-05-26 LAB — LIPID PANEL
Cholesterol: 144 mg/dL (ref 0–200)
HDL: 40 mg/dL — AB (ref >40)
LDL CALC: 53 mg/dL (ref 0–99)
TRIGLYCERIDES: 254 mg/dL — AB (ref <150)
Total CHOL/HDL Ratio: 3.6 RATIO
VLDL: 51 mg/dL — AB (ref 0–40)

## 2016-05-26 LAB — HEMOGLOBIN A1C
HEMOGLOBIN A1C: 8 % — AB (ref 4.8–5.6)
Mean Plasma Glucose: 183 mg/dL

## 2016-05-26 LAB — GLUCOSE, CAPILLARY
GLUCOSE-CAPILLARY: 162 mg/dL — AB (ref 65–99)
GLUCOSE-CAPILLARY: 177 mg/dL — AB (ref 65–99)
Glucose-Capillary: 155 mg/dL — ABNORMAL HIGH (ref 65–99)
Glucose-Capillary: 176 mg/dL — ABNORMAL HIGH (ref 65–99)

## 2016-05-26 LAB — TSH: TSH: 3.49 u[IU]/mL (ref 0.350–4.500)

## 2016-05-26 LAB — HIV ANTIBODY (ROUTINE TESTING W REFLEX): HIV Screen 4th Generation wRfx: NONREACTIVE

## 2016-05-26 LAB — CALCIUM, IONIZED: Calcium, Ionized, Serum: 4.9 mg/dL (ref 4.5–5.6)

## 2016-05-26 SURGERY — LEFT HEART CATH AND CORONARY ANGIOGRAPHY
Anesthesia: LOCAL

## 2016-05-26 MED ORDER — SODIUM CHLORIDE 0.9% FLUSH: 3.0000 mL | Freq: Two times a day (BID) | INTRAVENOUS | Status: DC

## 2016-05-26 MED ORDER — PRAVASTATIN SODIUM 40 MG PO TABS: 40.0000 mg | ORAL_TABLET | Freq: Every day | ORAL | Status: DC

## 2016-05-26 MED ORDER — ACETAMINOPHEN 325 MG PO TABS: 650.0000 mg | ORAL_TABLET | ORAL | Status: DC | PRN

## 2016-05-26 MED ORDER — ASPIRIN EC 81 MG PO TBEC
81.0000 mg | DELAYED_RELEASE_TABLET | Freq: Every day | ORAL | Status: DC
  Administered 2016-05-26: 81 mg via ORAL
  Filled 2016-05-26: qty 1

## 2016-05-26 MED ORDER — ONDANSETRON HCL 4 MG/2ML IJ SOLN: 4.0000 mg | Freq: Four times a day (QID) | INTRAMUSCULAR | Status: DC | PRN

## 2016-05-26 MED ORDER — MORPHINE SULFATE (PF) 2 MG/ML IV SOLN: 2.0000 mg | INTRAVENOUS | Status: DC | PRN

## 2016-05-26 MED ORDER — CARVEDILOL 12.5 MG PO TABS
12.5000 mg | ORAL_TABLET | Freq: Two times a day (BID) | ORAL | Status: DC
  Administered 2016-05-26 – 2016-05-27 (×3): 12.5 mg via ORAL
  Filled 2016-05-26 (×3): qty 1

## 2016-05-26 MED ORDER — NITROGLYCERIN 1 MG/10 ML FOR IR/CATH LAB
INTRA_ARTERIAL | Status: AC
  Filled 2016-05-26: qty 10

## 2016-05-26 MED ORDER — LIDOCAINE HCL (PF) 1 % IJ SOLN
INTRAMUSCULAR | Status: DC | PRN
  Administered 2016-05-26: 1 mL

## 2016-05-26 MED ORDER — HEPARIN SODIUM (PORCINE) 1000 UNIT/ML IJ SOLN
INTRAMUSCULAR | Status: AC
  Filled 2016-05-26: qty 1

## 2016-05-26 MED ORDER — IOPAMIDOL (ISOVUE-370) INJECTION 76%
INTRAVENOUS | Status: DC | PRN
  Administered 2016-05-26: 90 mL via INTRAVENOUS

## 2016-05-26 MED ORDER — VERAPAMIL HCL 2.5 MG/ML IV SOLN
INTRAVENOUS | Status: AC
  Filled 2016-05-26: qty 2

## 2016-05-26 MED ORDER — HEPARIN (PORCINE) IN NACL 2-0.9 UNIT/ML-% IJ SOLN
INTRAMUSCULAR | Status: DC | PRN
  Administered 2016-05-26: 1000 mL

## 2016-05-26 MED ORDER — ASPIRIN 81 MG PO CHEW: 81.0000 mg | CHEWABLE_TABLET | ORAL | Status: DC

## 2016-05-26 MED ORDER — POTASSIUM CHLORIDE CRYS ER 20 MEQ PO TBCR
40.0000 meq | EXTENDED_RELEASE_TABLET | Freq: Once | ORAL | Status: AC
  Administered 2016-05-26: 40 meq via ORAL
  Filled 2016-05-26: qty 2

## 2016-05-26 MED ORDER — SODIUM CHLORIDE 0.9 % IV SOLN
INTRAVENOUS | Status: DC
  Administered 2016-05-26: 11:00:00 via INTRAVENOUS

## 2016-05-26 MED ORDER — LIDOCAINE HCL (PF) 1 % IJ SOLN
INTRAMUSCULAR | Status: AC
  Filled 2016-05-26: qty 30

## 2016-05-26 MED ORDER — SODIUM CHLORIDE 0.9 % IV SOLN: 250.0000 mL | INTRAVENOUS | Status: DC | PRN

## 2016-05-26 MED ORDER — HEPARIN SODIUM (PORCINE) 1000 UNIT/ML IJ SOLN
INTRAMUSCULAR | Status: DC | PRN
  Administered 2016-05-26: 5000 [IU] via INTRAVENOUS

## 2016-05-26 MED ORDER — IPRATROPIUM BROMIDE 0.02 % IN SOLN: 0.5000 mg | RESPIRATORY_TRACT | Status: DC | PRN

## 2016-05-26 MED ORDER — ASPIRIN 81 MG PO CHEW
81.0000 mg | CHEWABLE_TABLET | Freq: Every day | ORAL | Status: DC
  Administered 2016-05-27: 81 mg via ORAL
  Filled 2016-05-26: qty 1

## 2016-05-26 MED ORDER — HEPARIN (PORCINE) IN NACL 2-0.9 UNIT/ML-% IJ SOLN
INTRAMUSCULAR | Status: AC
  Filled 2016-05-26: qty 1000

## 2016-05-26 MED ORDER — MAGNESIUM SULFATE 2 GM/50ML IV SOLN
2.0000 g | Freq: Once | INTRAVENOUS | Status: AC
  Administered 2016-05-26: 2 g via INTRAVENOUS
  Filled 2016-05-26: qty 50

## 2016-05-26 MED ORDER — PANTOPRAZOLE SODIUM 40 MG PO TBEC
40.0000 mg | DELAYED_RELEASE_TABLET | Freq: Every day | ORAL | Status: DC
  Administered 2016-05-27: 40 mg via ORAL
  Filled 2016-05-26: qty 1

## 2016-05-26 MED ORDER — IOPAMIDOL (ISOVUE-370) INJECTION 76%
INTRAVENOUS | Status: AC
  Filled 2016-05-26: qty 100

## 2016-05-26 MED ORDER — SODIUM CHLORIDE 0.9% FLUSH: 3.0000 mL | INTRAVENOUS | Status: DC | PRN

## 2016-05-26 MED ORDER — ATORVASTATIN CALCIUM 80 MG PO TABS
80.0000 mg | ORAL_TABLET | Freq: Every day | ORAL | Status: DC
  Administered 2016-05-26: 80 mg via ORAL
  Filled 2016-05-26: qty 1

## 2016-05-26 MED ORDER — VERAPAMIL HCL 2.5 MG/ML IV SOLN
INTRA_ARTERIAL | Status: DC | PRN
  Administered 2016-05-26: 10 mL via INTRA_ARTERIAL

## 2016-05-26 MED ORDER — SODIUM CHLORIDE 0.9 % IV SOLN
INTRAVENOUS | Status: AC
  Administered 2016-05-26: 13:00:00 via INTRAVENOUS

## 2016-05-26 SURGICAL SUPPLY — 13 items
CATH EXPO 5FR ANG PIGTAIL 145 (CATHETERS) ×2 IMPLANT
CATH EXPO 5FR FR4 (CATHETERS) ×2 IMPLANT
CATH OPTITORQUE TIG 4.0 5F (CATHETERS) ×2 IMPLANT
DEVICE RAD COMP TR BAND LRG (VASCULAR PRODUCTS) ×2 IMPLANT
GLIDESHEATH SLEND A-KIT 6F 22G (SHEATH) ×2 IMPLANT
GUIDEWIRE INQWIRE 1.5J.035X260 (WIRE) ×1 IMPLANT
INQWIRE 1.5J .035X260CM (WIRE) ×2
KIT HEART LEFT (KITS) ×2 IMPLANT
PACK CARDIAC CATHETERIZATION (CUSTOM PROCEDURE TRAY) ×2 IMPLANT
SYR MEDRAD MARK V 150ML (SYRINGE) ×2 IMPLANT
TRANSDUCER W/STOPCOCK (MISCELLANEOUS) ×2 IMPLANT
TUBING CIL FLEX 10 FLL-RA (TUBING) ×2 IMPLANT
WIRE HI TORQ VERSACORE-J 145CM (WIRE) ×2 IMPLANT

## 2016-05-26 NOTE — Consult Note (Signed)
   [facility] CM Inpatient Consult   05/26/2016  [name] 1950/08/17 [id number]    Came to visit [name] on behalf of [employer] to [facility] Care Management program for [employer] employees/dependents with [facility] insurance. Spoke with [name] at bedside to explain [employer] to [employer]. He endorses that he has DM. States he will look over the information and think about it for enrollment for DM management. Confirmed best contact number as [phone] for post discharge call. Provided [employer] and contact information. Appreciative of visit. Inpatient RNCM aware of bedside encounter.   Marthenia Rolling, MSN-Ed, RN,BSN [facility] Liaison [phone]

## 2016-05-26 NOTE — Progress Notes (Signed)
PROGRESS NOTE    Barry Alexander  VHQ:[id number] DOB: 05/24/1950 DOA: 05/25/2016 PCP: Orpah Melter, MD   Outpatient Specialists:     Brief Narrative:  Barry Alexander is a 66 y.o. male  with past history of diabetes and hypertension presents emergency room with tachycardia and diaphoresis. Patient states that over last 3 weeks he's had a productive cough of white sputum. Hasn't taken any medication for this. Has not really improved. Over this time he also has some shortness of breath. Today he was working with no difficulties. He went up to get a piece of equipment and all of a sudden his heart began racing started to poor sweat. Patient works at [employer] and made his way to emergency room.  ED course: EKG confirmed wide complex tachycardia and patient was given adenosine. This was effective and patient's rate became regular. Cardiology was consulted by the EDP but they declined admission and asked the hospitalist to admit patient. Hospitalist consulted. Initial troponin negative.   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 Allergies/GERD- after cath will add medications  DM SSI ACHS A1c: 8 -holding actos/amaryl/janumet  Hypertension When necessary hydralazine 10 mg IV as needed for severe blood pressure  2.1 cm left renal mass, indeterminate by a nonenhanced CT criteria.  Further evaluation with renal ultrasound or contrast-enhanced abdominal CT  as an outpateint   DVT prophylaxis:   Fully anticoagulated   Code Status: Full Code   Family Communication: patient and wife  Disposition Plan:     Consultants:   cards    Subjective: + cough-- non -productive  Objective: Vitals:   05/25/16 1927 05/25/16 2212 05/26/16 0507 05/26/16 0950  BP:  121/79 139/78   Pulse:  84 79   Resp:  16 18   Temp:  97.7 F (36.5 C) 98 F (36.7 C)   TempSrc:  Oral Oral   SpO2: 97% 92% 94% 91%  Weight:      Height:        Intake/Output Summary (Last 24 hours) at 05/26/16 1050 Last data filed at 05/26/16 0500  Gross per 24 hour  Intake           258.68 ml  Output                0 ml  Net           258.68 ml   Filed Weights   05/25/16 1214  Weight: 116.5 kg (256 lb 12.8 oz)    Examination:  General exam: Appears calm and comfortable  Respiratory system: Clear to auscultation. Respiratory effort normal. Cardiovascular system: S1 & S2 heard, RRR. No JVD, murmurs, rubs, gallops or clicks. No pedal edema. Gastrointestinal system: Abdomen is obese, soft and nontender. No organomegaly or masses felt. Normal bowel sounds heard. Central nervous system: Alert and oriented. No focal neurological deficits. Extremities: Symmetric 5 x 5 power.     Data Reviewed: I have personally reviewed following labs and imaging  studies  CBC:  Recent Labs Lab 05/25/16 0815 05/25/16 1053 05/26/16 0151  WBC 14.4* 6.9 7.9  HGB 16.5 15.3 14.2  HCT 48.1 43.5 40.4  MCV 91.6 90.8 90.0  PLT 190 117* 892*   Basic Metabolic Panel:  Recent Labs Lab 05/25/16 0815 05/25/16 1053 05/26/16 0151  NA 137  --  139  K 4.2  --  3.5  CL 103  --  100*  CO2 24  --  26  GLUCOSE 277*  --  121*  BUN 22*  --  20  CREATININE 1.29* 1.14 1.00  CALCIUM 10.1  --  9.0  MG  --  1.7  --   PHOS  --  3.0  --    GFR: Estimated Creatinine Clearance: 95.2 mL/min (by C-G formula based on SCr of 1 mg/dL). Liver Function Tests: No results for input(s): AST,  ALT, ALKPHOS, BILITOT, PROT, ALBUMIN in the last 168 hours. No results for input(s): LIPASE, AMYLASE in the last 168 hours. No results for input(s): AMMONIA in the last 168 hours. Coagulation Profile:  Recent Labs Lab 05/25/16 0815  INR 1.03   Cardiac Enzymes:  Recent Labs Lab 05/25/16 1053 05/25/16 1306 05/25/16 1617  TROPONINI 0.86* 2.64* 4.52*   BNP (last 3 results) No results for input(s): PROBNP in the last 8760 hours. HbA1C:  Recent Labs  05/25/16 1056  HGBA1C 8.0*   CBG:  Recent Labs Lab 05/25/16 1246 05/25/16 1702 05/25/16 2216 05/26/16 0641  GLUCAP 208* 161* 102* 155*   Lipid Profile:  Recent Labs  05/26/16 0151  CHOL 144  HDL 40*  LDLCALC 53  TRIG 254*  CHOLHDL 3.6   Thyroid Function Tests: No results for input(s): TSH, T4TOTAL, FREET4, T3FREE, THYROIDAB in the last 72 hours. Anemia Panel: No results for input(s): VITAMINB12, FOLATE, FERRITIN, TIBC, IRON, RETICCTPCT in the last 72 hours. Urine analysis: No results found for: COLORURINE, APPEARANCEUR, LABSPEC, PHURINE, GLUCOSEU, HGBUR, BILIRUBINUR, KETONESUR, PROTEINUR, UROBILINOGEN, NITRITE, LEUKOCYTESUR    Recent Results (from the past 240 hour(s))  Culture, expectorated sputum-assessment     Status: None   Collection Time: 05/25/16  7:38 PM  Result Value Ref Range Status   Specimen Description EXPECTORATED SPUTUM  Final   Special Requests Normal  Final   Sputum evaluation   Final    Sputum specimen not acceptable for testing.  Please recollect.     Report Status 05/25/2016 FINAL  Final      Anti-infectives    Start     Dose/Rate Route Frequency Ordered Stop   05/25/16 1130  cefTRIAXone (ROCEPHIN) 1 g in dextrose 5 % 50 mL IVPB     1 g 100 mL/hr over 30 Minutes Intravenous Every 24 hours 05/25/16 1128         Radiology Studies: Ct Chest Wo Contrast  Result Date: 05/25/2016 CLINICAL DATA:  Productive cough for 3 weeks.  Atrial fibrillation. EXAM: CT CHEST WITHOUT CONTRAST  TECHNIQUE: Multidetector CT imaging of the chest was performed following the standard protocol without IV contrast. COMPARISON:  Chest radiograph 05/25/2016 FINDINGS: Cardiovascular: Normal contour of the great vessels. Advanced calcific atherosclerotic disease of the coronary arteries. Milder atherosclerotic disease and tortuosity of the aorta. The heart is normal in size. There is no pericardial effusion. Mediastinum/Nodes: No enlarged mediastinal or axillary lymph nodes. Thyroid gland, trachea, and esophagus demonstrate no significant findings. Lungs/Pleura: Lungs are clear. No pleural effusion or pneumothorax. Upper Abdomen: No acute abnormality. 6 cm water density lobulated mass in the left lobe  of the liver likely represents a cyst. There is a 2.1 cm circumscribed exophytic mass off of the midpole lateral left renal cortex, which does not satisfy the criteria for a simple cyst by density. 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 IMPRESSION: Advanced calcific atherosclerotic disease of the coronary arteries. Mild atherosclerotic disease of the aorta. No evidence of pulmonary consolidation. 2.1 cm left renal mass, indeterminate by a nonenhanced CT criteria. Further evaluation with renal ultrasound or contrast-enhanced abdominal CT may be considered if found clinically necessary. Electronically Signed   By: Fidela Salisbury M.D.   On: 05/25/2016 13:09   Dg Chest Portable 1 View  Result Date: 05/25/2016 CLINICAL DATA:  66 year old male with central and left-sided chest pain accompanied by weakness and shortness of breath. EXAM: PORTABLE CHEST 1 VIEW COMPARISON:  None FINDINGS: The lungs are clear and negative for focal airspace consolidation, pulmonary edema or suspicious pulmonary nodule. No pleural effusion or pneumothorax. Cardiac and mediastinal contours are within normal limits. No acute fracture or lytic or blastic osseous lesions. The visualized upper abdominal bowel  gas pattern is unremarkable. IMPRESSION: Negative chest x-ray. Electronically Signed   By: Jacqulynn Cadet M.D.   On: 05/25/2016 08:35        Scheduled Meds: . aspirin  81 mg Oral Pre-Cath  . aspirin EC  81 mg Oral Daily  . atorvastatin  80 mg Oral q1800  . carvedilol  12.5 mg Oral BID  . cefTRIAXone (ROCEPHIN)  IV  1 g Intravenous Q24H  . guaiFENesin  600 mg Oral BID  . insulin aspart  0-20 Units Subcutaneous TID WC  . ipratropium  0.5 mg Nebulization QID  . sodium chloride flush  3 mL Intravenous Q12H   Continuous Infusions: . sodium chloride    . heparin 1,550 Units/hr (05/26/16 0341)     LOS: 0 days    Time spent: 25 min    [place], DO [facility] Pager [phone]  If 7PM-7AM, please contact night-coverage [email or web address] Password [email or web address] 05/26/2016, 10:50 AM

## 2016-05-26 NOTE — Progress Notes (Signed)
Progress Note  Patient Name: Barry Alexander Date of Encounter: 05/26/2016  Primary Cardiologist: New to [facility]  Subjective   Denies any chest discomfort or palpitations overnight. Breathing at baseline.   Inpatient Medications    Scheduled Meds: . aspirin EC  325 mg Oral Daily  . cefTRIAXone (ROCEPHIN)  IV  1 g Intravenous Q24H  . guaiFENesin  600 mg Oral BID  . insulin aspart  0-20 Units Subcutaneous TID WC  . ipratropium  0.5 mg Nebulization QID   Continuous Infusions: . heparin 1,550 Units/hr (05/26/16 0341)   PRN Meds: acetaminophen, ALPRAZolam, diltiazem, hydrALAZINE, morphine injection, nitroGLYCERIN, ondansetron (ZOFRAN) IV, zolpidem   Vital Signs    Vitals:   05/25/16 1656 05/25/16 1927 05/25/16 2212 05/26/16 0507  BP: 134/79  121/79 139/78  Pulse: 78  84 79  Resp: 20  16 18   Temp: 97.4 F (36.3 C)  97.7 F (36.5 C) 98 F (36.7 C)  TempSrc: Oral  Oral Oral  SpO2: 94% 97% 92% 94%  Weight:      Height:        Intake/Output Summary (Last 24 hours) at 05/26/16 0741 Last data filed at 05/26/16 0500  Gross per 24 hour  Intake           458.68 ml  Output                0 ml  Net           458.68 ml   Filed Weights   05/25/16 1214  Weight: 256 lb 12.8 oz (116.5 kg)    Telemetry    Sinus rhythm, HR in 70's - 80's.  - Personally Reviewed  ECG    3/18 - Initially read as atrial fibrillation, but p-waves are noted. RBBB - Personally Reviewed  Physical Exam   General: Well developed, well nourished Caucasian male appearing in no acute distress. Head: Normocephalic, atraumatic.  Neck: Supple without bruits, JVD not elevated. Lungs:  Resp regular and unlabored, CTA without wheezing or rales. Heart: RRR, S1, S2, no S3, S4, or murmur; no rub. Abdomen: Soft, non-tender, non-distended with normoactive bowel sounds. No hepatomegaly. No rebound/guarding. No obvious abdominal masses. Extremities: No clubbing, cyanosis, or edema. Distal pedal pulses are 2+  bilaterally. Neuro: Alert and oriented X 3. Moves all extremities spontaneously. Psych: Normal affect.  Labs    Chemistry Recent Labs Lab 05/25/16 0815 05/25/16 1053 05/26/16 0151  NA 137  --  139  K 4.2  --  3.5  CL 103  --  100*  CO2 24  --  26  GLUCOSE 277*  --  121*  BUN 22*  --  20  CREATININE 1.29* 1.14 1.00  CALCIUM 10.1  --  9.0  GFRNONAA 57* >60 >60  GFRAA >60 >60 >60  ANIONGAP 10  --  13     Hematology Recent Labs Lab 05/25/16 0815 05/25/16 1053 05/26/16 0151  WBC 14.4* 6.9 7.9  RBC 5.25 4.79 4.49  HGB 16.5 15.3 14.2  HCT 48.1 43.5 40.4  MCV 91.6 90.8 90.0  MCH 31.4 31.9 31.6  MCHC 34.3 35.2 35.1  RDW 13.6 13.7 13.7  PLT 190 117* 101*    Cardiac Enzymes Recent Labs Lab 05/25/16 1053 05/25/16 1306 05/25/16 1617  TROPONINI 0.86* 2.64* 4.52*    Recent Labs Lab 05/25/16 0821  TROPIPOC 0.00     BNPNo results for input(s): BNP, PROBNP in the last 168 hours.   DDimer No results for input(s): DDIMER  in the last 168 hours.   Radiology    Ct Chest Wo Contrast  Result Date: 05/25/2016 CLINICAL DATA:  Productive cough for 3 weeks.  Atrial fibrillation. EXAM: CT CHEST WITHOUT CONTRAST TECHNIQUE: Multidetector CT imaging of the chest was performed following the standard protocol without IV contrast. COMPARISON:  Chest radiograph 05/25/2016 FINDINGS: Cardiovascular: Normal contour of the great vessels. Advanced calcific atherosclerotic disease of the coronary arteries. Milder atherosclerotic disease and tortuosity of the aorta. The heart is normal in size. There is no pericardial effusion. Mediastinum/Nodes: No enlarged mediastinal or axillary lymph nodes. Thyroid gland, trachea, and esophagus demonstrate no significant findings. Lungs/Pleura: Lungs are clear. No pleural effusion or pneumothorax. Upper Abdomen: No acute abnormality. 6 cm water density lobulated mass in the left lobe of the liver likely represents a cyst. There is a 2.1 cm circumscribed  exophytic mass off of the midpole lateral left renal cortex, which does not satisfy the criteria for a simple cyst by density. 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 IMPRESSION: Advanced calcific atherosclerotic disease of the coronary arteries. Mild atherosclerotic disease of the aorta. No evidence of pulmonary consolidation. 2.1 cm left renal mass, indeterminate by a nonenhanced CT criteria. Further evaluation with renal ultrasound or contrast-enhanced abdominal CT may be considered if found clinically necessary. Electronically Signed   By: Fidela Salisbury M.D.   On: 05/25/2016 13:09   Dg Chest Portable 1 View  Result Date: 05/25/2016 CLINICAL DATA:  66 year old male with central and left-sided chest pain accompanied by weakness and shortness of breath. EXAM: PORTABLE CHEST 1 VIEW COMPARISON:  None FINDINGS: The lungs are clear and negative for focal airspace consolidation, pulmonary edema or suspicious pulmonary nodule. No pleural effusion or pneumothorax. Cardiac and mediastinal contours are within normal limits. No acute fracture or lytic or blastic osseous lesions. The visualized upper abdominal bowel gas pattern is unremarkable. IMPRESSION: Negative chest x-ray. Electronically Signed   By: Jacqulynn Cadet M.D.   On: 05/25/2016 08:35    Cardiac Studies   Echocardiogram: Pending  Patient Profile     66 y.o. male w/ PMH of HTN, HLD, Type 2 DM, and obesity who presented to [facility] ED on 05/25/2016 for evaluation of palpitations, found to be in a wide-complex tachycardia with conversion to NSR following administration of Adenosine.  Cards consulted for elevated troponin.   Assessment & Plan    1. Elevated Troponin/Unstable angina - troponin values this admission have been at 0.86, 2.64, and 4.52. - chest CT on 3/18 shows advanced calcific atherosclerotic disease of coronary arteries. - would likely benefit from a cardiac catheterization for definitive  evaluation as he does have cardiac risk factors including HTN, HLD, Type 2 DM, Family history of CAD (brother having CABG in his 76's), and known coronary calcifications by CT. Will discuss further with Dr. Angelena Form. Keep NPO.   2. Wide-complex tachycardia - presented with palpitations and found to be in a wide-complex tachycardia. 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  - K+ 3.5. Mg 1.7 on 3/18 (will replace to keep > 2.0).    3. HTN - BP variable at 121/76 - 133/113 in the past 24 hours.  - on Lotensin 20-25mg  daily and Coreg 12.5mg  BID prior to admission. Will resume Coreg 12.5mg  BID.   4. HLD - Lipid Panel this admission shows total cholesterol 144, HDL 40, and LDL 53. - on Lovastatin 40mg  daily prior to admission.   5.  Type 2 DM - per admitting team.  6. Renal Mass - 2.1 cm left renal mass noted on CT.  - further evaluation per admitting team.   Signed, Erma Heritage , PA-C 7:41 AM 05/26/2016 Pager: [phone]  I have personally seen and examined this patient with Bernerd Pho, PA-C. I agree with the assessment and plan as outlined above. He is admitted with WCT and jaw pain. Troponin is elevated. He has risk factors for CAD including DM, HTN, HLD, obesity and FH of CAD. CT chest with evidence of coronary calcification. Will plan cardiac cath today to exclude obstructive CAD. He is NPO. Risks and benefits of cath reviewed with pt.   Lauree Chandler 05/26/2016 8:50 AM

## 2016-05-26 NOTE — Interval H&P Note (Signed)
Cath Lab Visit (complete for each Cath Lab visit)  Clinical Evaluation Leading to the Procedure:   ACS: Yes.    Non-ACS:    Anginal Classification: CCS III  Anti-ischemic medical therapy: No Therapy  Non-Invasive Test Results: No non-invasive testing performed  Prior CABG: No previous CABG      History and Physical Interval Note:  05/26/2016 12:02 PM  Barry Alexander  has presented today for surgery, with the diagnosis of n stemi  The various methods of treatment have been discussed with the patient and family. After consideration of risks, benefits and other options for treatment, the patient has consented to  Procedure(s): Left Heart Cath and Coronary Angiography (N/A) as a surgical intervention .  The patient's history has been reviewed, patient examined, no change in status, stable for surgery.  I have reviewed the patient's chart and labs.  Questions were answered to the patient's satisfaction.     Quay Burow

## 2016-05-26 NOTE — H&P (View-Only) (Signed)
Progress Note  Patient Name: Barry Alexander Date of Encounter: 05/26/2016  Primary Cardiologist: New to [facility]  Subjective   Denies any chest discomfort or palpitations overnight. Breathing at baseline.   Inpatient Medications    Scheduled Meds: . aspirin EC  325 mg Oral Daily  . cefTRIAXone (ROCEPHIN)  IV  1 g Intravenous Q24H  . guaiFENesin  600 mg Oral BID  . insulin aspart  0-20 Units Subcutaneous TID WC  . ipratropium  0.5 mg Nebulization QID   Continuous Infusions: . heparin 1,550 Units/hr (05/26/16 0341)   PRN Meds: acetaminophen, ALPRAZolam, diltiazem, hydrALAZINE, morphine injection, nitroGLYCERIN, ondansetron (ZOFRAN) IV, zolpidem   Vital Signs    Vitals:   05/25/16 1656 05/25/16 1927 05/25/16 2212 05/26/16 0507  BP: 134/79  121/79 139/78  Pulse: 78  84 79  Resp: 20  16 18   Temp: 97.4 F (36.3 C)  97.7 F (36.5 C) 98 F (36.7 C)  TempSrc: Oral  Oral Oral  SpO2: 94% 97% 92% 94%  Weight:      Height:        Intake/Output Summary (Last 24 hours) at 05/26/16 0741 Last data filed at 05/26/16 0500  Gross per 24 hour  Intake           458.68 ml  Output                0 ml  Net           458.68 ml   Filed Weights   05/25/16 1214  Weight: 256 lb 12.8 oz (116.5 kg)    Telemetry    Sinus rhythm, HR in 70's - 80's.  - Personally Reviewed  ECG    3/18 - Initially read as atrial fibrillation, but p-waves are noted. RBBB - Personally Reviewed  Physical Exam   General: Well developed, well nourished Caucasian male appearing in no acute distress. Head: Normocephalic, atraumatic.  Neck: Supple without bruits, JVD not elevated. Lungs:  Resp regular and unlabored, CTA without wheezing or rales. Heart: RRR, S1, S2, no S3, S4, or murmur; no rub. Abdomen: Soft, non-tender, non-distended with normoactive bowel sounds. No hepatomegaly. No rebound/guarding. No obvious abdominal masses. Extremities: No clubbing, cyanosis, or edema. Distal pedal pulses are 2+  bilaterally. Neuro: Alert and oriented X 3. Moves all extremities spontaneously. Psych: Normal affect.  Labs    Chemistry Recent Labs Lab 05/25/16 0815 05/25/16 1053 05/26/16 0151  NA 137  --  139  K 4.2  --  3.5  CL 103  --  100*  CO2 24  --  26  GLUCOSE 277*  --  121*  BUN 22*  --  20  CREATININE 1.29* 1.14 1.00  CALCIUM 10.1  --  9.0  GFRNONAA 57* >60 >60  GFRAA >60 >60 >60  ANIONGAP 10  --  13     Hematology Recent Labs Lab 05/25/16 0815 05/25/16 1053 05/26/16 0151  WBC 14.4* 6.9 7.9  RBC 5.25 4.79 4.49  HGB 16.5 15.3 14.2  HCT 48.1 43.5 40.4  MCV 91.6 90.8 90.0  MCH 31.4 31.9 31.6  MCHC 34.3 35.2 35.1  RDW 13.6 13.7 13.7  PLT 190 117* 101*    Cardiac Enzymes Recent Labs Lab 05/25/16 1053 05/25/16 1306 05/25/16 1617  TROPONINI 0.86* 2.64* 4.52*    Recent Labs Lab 05/25/16 0821  TROPIPOC 0.00     BNPNo results for input(s): BNP, PROBNP in the last 168 hours.   DDimer No results for input(s): DDIMER  in the last 168 hours.   Radiology    Ct Chest Wo Contrast  Result Date: 05/25/2016 CLINICAL DATA:  Productive cough for 3 weeks.  Atrial fibrillation. EXAM: CT CHEST WITHOUT CONTRAST TECHNIQUE: Multidetector CT imaging of the chest was performed following the standard protocol without IV contrast. COMPARISON:  Chest radiograph 05/25/2016 FINDINGS: Cardiovascular: Normal contour of the great vessels. Advanced calcific atherosclerotic disease of the coronary arteries. Milder atherosclerotic disease and tortuosity of the aorta. The heart is normal in size. There is no pericardial effusion. Mediastinum/Nodes: No enlarged mediastinal or axillary lymph nodes. Thyroid gland, trachea, and esophagus demonstrate no significant findings. Lungs/Pleura: Lungs are clear. No pleural effusion or pneumothorax. Upper Abdomen: No acute abnormality. 6 cm water density lobulated mass in the left lobe of the liver likely represents a cyst. There is a 2.1 cm circumscribed  exophytic mass off of the midpole lateral left renal cortex, which does not satisfy the criteria for a simple cyst by density. 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 IMPRESSION: Advanced calcific atherosclerotic disease of the coronary arteries. Mild atherosclerotic disease of the aorta. No evidence of pulmonary consolidation. 2.1 cm left renal mass, indeterminate by a nonenhanced CT criteria. Further evaluation with renal ultrasound or contrast-enhanced abdominal CT may be considered if found clinically necessary. Electronically Signed   By: Fidela Salisbury M.D.   On: 05/25/2016 13:09   Dg Chest Portable 1 View  Result Date: 05/25/2016 CLINICAL DATA:  66 year old male with central and left-sided chest pain accompanied by weakness and shortness of breath. EXAM: PORTABLE CHEST 1 VIEW COMPARISON:  None FINDINGS: The lungs are clear and negative for focal airspace consolidation, pulmonary edema or suspicious pulmonary nodule. No pleural effusion or pneumothorax. Cardiac and mediastinal contours are within normal limits. No acute fracture or lytic or blastic osseous lesions. The visualized upper abdominal bowel gas pattern is unremarkable. IMPRESSION: Negative chest x-ray. Electronically Signed   By: Jacqulynn Cadet M.D.   On: 05/25/2016 08:35    Cardiac Studies   Echocardiogram: Pending  Patient Profile     66 y.o. male w/ PMH of HTN, HLD, Type 2 DM, and obesity who presented to [facility] ED on 05/25/2016 for evaluation of palpitations, found to be in a wide-complex tachycardia with conversion to NSR following administration of Adenosine.  Cards consulted for elevated troponin.   Assessment & Plan    1. Elevated Troponin/Unstable angina - troponin values this admission have been at 0.86, 2.64, and 4.52. - chest CT on 3/18 shows advanced calcific atherosclerotic disease of coronary arteries. - would likely benefit from a cardiac catheterization for definitive  evaluation as he does have cardiac risk factors including HTN, HLD, Type 2 DM, Family history of CAD (brother having CABG in his 47's), and known coronary calcifications by CT. Will discuss further with Dr. Angelena Form. Keep NPO.   2. Wide-complex tachycardia - presented with palpitations and found to be in a wide-complex tachycardia. 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  - K+ 3.5. Mg 1.7 on 3/18 (will replace to keep > 2.0).    3. HTN - BP variable at 121/76 - 133/113 in the past 24 hours.  - on Lotensin 20-25mg  daily and Coreg 12.5mg  BID prior to admission. Will resume Coreg 12.5mg  BID.   4. HLD - Lipid Panel this admission shows total cholesterol 144, HDL 40, and LDL 53. - on Lovastatin 40mg  daily prior to admission.   5.  Type 2 DM - per admitting team.  6. Renal Mass - 2.1 cm left renal mass noted on CT.  - further evaluation per admitting team.   Signed, Erma Heritage , PA-C 7:41 AM 05/26/2016 Pager: [phone]  I have personally seen and examined this patient with Bernerd Pho, PA-C. I agree with the assessment and plan as outlined above. He is admitted with WCT and jaw pain. Troponin is elevated. He has risk factors for CAD including DM, HTN, HLD, obesity and FH of CAD. CT chest with evidence of coronary calcification. Will plan cardiac cath today to exclude obstructive CAD. He is NPO. Risks and benefits of cath reviewed with pt.   Lauree Chandler 05/26/2016 8:50 AM

## 2016-05-27 ENCOUNTER — Observation Stay (HOSPITAL_BASED_OUTPATIENT_CLINIC_OR_DEPARTMENT_OTHER): Payer: 59

## 2016-05-27 DIAGNOSIS — R9431 Abnormal electrocardiogram [ECG] [EKG]: Secondary | ICD-10-CM | POA: Diagnosis not present

## 2016-05-27 DIAGNOSIS — I25110 Atherosclerotic heart disease of native coronary artery with unstable angina pectoris: Secondary | ICD-10-CM | POA: Diagnosis not present

## 2016-05-27 DIAGNOSIS — R748 Abnormal levels of other serum enzymes: Secondary | ICD-10-CM | POA: Diagnosis not present

## 2016-05-27 DIAGNOSIS — E1165 Type 2 diabetes mellitus with hyperglycemia: Secondary | ICD-10-CM | POA: Diagnosis not present

## 2016-05-27 DIAGNOSIS — I480 Paroxysmal atrial fibrillation: Secondary | ICD-10-CM | POA: Diagnosis not present

## 2016-05-27 DIAGNOSIS — R05 Cough: Secondary | ICD-10-CM

## 2016-05-27 DIAGNOSIS — I214 Non-ST elevation (NSTEMI) myocardial infarction: Secondary | ICD-10-CM | POA: Diagnosis not present

## 2016-05-27 DIAGNOSIS — N2889 Other specified disorders of kidney and ureter: Secondary | ICD-10-CM | POA: Diagnosis not present

## 2016-05-27 DIAGNOSIS — I471 Supraventricular tachycardia: Secondary | ICD-10-CM

## 2016-05-27 DIAGNOSIS — I10 Essential (primary) hypertension: Secondary | ICD-10-CM | POA: Diagnosis not present

## 2016-05-27 DIAGNOSIS — I2510 Atherosclerotic heart disease of native coronary artery without angina pectoris: Secondary | ICD-10-CM | POA: Diagnosis not present

## 2016-05-27 DIAGNOSIS — E119 Type 2 diabetes mellitus without complications: Secondary | ICD-10-CM | POA: Diagnosis not present

## 2016-05-27 DIAGNOSIS — Z7984 Long term (current) use of oral hypoglycemic drugs: Secondary | ICD-10-CM | POA: Diagnosis not present

## 2016-05-27 DIAGNOSIS — E785 Hyperlipidemia, unspecified: Secondary | ICD-10-CM | POA: Diagnosis not present

## 2016-05-27 LAB — CBC
HEMATOCRIT: 40.7 % (ref 39.0–52.0)
Hemoglobin: 14 g/dL (ref 13.0–17.0)
MCH: 31 pg (ref 26.0–34.0)
MCHC: 34.4 g/dL (ref 30.0–36.0)
MCV: 90.2 fL (ref 78.0–100.0)
Platelets: 104 10*3/uL — ABNORMAL LOW (ref 150–400)
RBC: 4.51 MIL/uL (ref 4.22–5.81)
RDW: 13.5 % (ref 11.5–15.5)
WBC: 6.2 10*3/uL (ref 4.0–10.5)

## 2016-05-27 LAB — ECHOCARDIOGRAM COMPLETE
Height: 69 in
WEIGHTICAEL: 4108.8 [oz_av]

## 2016-05-27 LAB — GLUCOSE, CAPILLARY
GLUCOSE-CAPILLARY: 199 mg/dL — AB (ref 65–99)
Glucose-Capillary: 180 mg/dL — ABNORMAL HIGH (ref 65–99)

## 2016-05-27 MED ORDER — DOXYCYCLINE HYCLATE 50 MG PO CAPS: 50.0000 mg | ORAL_CAPSULE | Freq: Two times a day (BID) | ORAL | 0 refills | Status: DC

## 2016-05-27 MED ORDER — ASPIRIN 81 MG PO CHEW: 81.0000 mg | CHEWABLE_TABLET | Freq: Every day | ORAL | Status: AC

## 2016-05-27 MED FILL — DOXYCYCLINE HYC 50 MG CAP: 50 | 3 days supply | Qty: 6 | Fill #0

## 2016-05-27 NOTE — Discharge Instructions (Signed)
PLEASE REMEMBER TO BRING ALL OF YOUR MEDICATIONS TO EACH OF YOUR FOLLOW-UP OFFICE VISITS.  PLEASE ATTEND ALL SCHEDULED FOLLOW-UP APPOINTMENTS.   Activity: Increase activity slowly as tolerated. You may shower, but no soaking baths (or swimming) for 1 week. No driving for 24 hours. No lifting over 5 lbs for 1 week. No sexual activity for 1 week.   You May Return to Work: on Friday (05/30/2016)  Wound Care: You may wash cath site gently with soap and water. Keep cath site clean and dry. If you notice pain, swelling, bleeding or pus at your cath site, please call [phone].

## 2016-05-27 NOTE — Progress Notes (Signed)
Progress Note  Patient Name: Barry Alexander Date of Encounter: 05/27/2016  Primary Cardiologist: Dr. Angelena Form  Subjective   No chest discomfort or palpitations. Right radial cath site without complications.   Inpatient Medications    Scheduled Meds: . aspirin  81 mg Oral Daily  . atorvastatin  80 mg Oral q1800  . carvedilol  12.5 mg Oral BID  . cefTRIAXone (ROCEPHIN)  IV  1 g Intravenous Q24H  . guaiFENesin  600 mg Oral BID  . insulin aspart  0-20 Units Subcutaneous TID WC  . pantoprazole  40 mg Oral Daily  . sodium chloride flush  3 mL Intravenous Q12H   Continuous Infusions:  PRN Meds: sodium chloride, acetaminophen, ALPRAZolam, diltiazem, hydrALAZINE, ipratropium, morphine injection, morphine injection, nitroGLYCERIN, ondansetron (ZOFRAN) IV, sodium chloride flush, zolpidem   Vital Signs    Vitals:   05/26/16 1636 05/26/16 2111 05/26/16 2130 05/27/16 0615  BP:   (!) 160/91 (!) 163/88  Pulse:  78 83 75  Resp:  18 18 20   Temp:   97.6 F (36.4 C) 97.8 F (36.6 C)  TempSrc:   Oral Oral  SpO2: 94%  96% 95%  Weight:      Height:        Intake/Output Summary (Last 24 hours) at 05/27/16 0647 Last data filed at 05/27/16 0500  Gross per 24 hour  Intake              600 ml  Output                0 ml  Net              600 ml   Filed Weights   05/25/16 1214  Weight: 256 lb 12.8 oz (116.5 kg)    Telemetry    NSR, HR 60's - 80's. Artifact noted.  - Personally Reviewed  ECG    SR, PAC's, HR 80. RBBB.  - Personally Reviewed  Physical Exam   General: Well developed, well nourished, male appearing in no acute distress. Head: Normocephalic, atraumatic.  Neck: Supple without bruits, JVD not elevated. Lungs:  Resp regular and unlabored, CTA without wheezing or rales. Heart: RRR, S1, S2, no S3, S4, or murmur; no rub. Abdomen: Soft, non-tender, non-distended with normoactive bowel sounds. No hepatomegaly. No rebound/guarding. No obvious abdominal  masses. Extremities: No clubbing, cyanosis, or edema. Distal pedal pulses are 2+ bilaterally. Right radial cath site without ecchymosis or evidence of a hematoma.  Neuro: Alert and oriented X 3. Moves all extremities spontaneously. Psych: Normal affect.  Labs    Chemistry Recent Labs Lab 05/25/16 0815 05/25/16 1053 05/26/16 0151  NA 137  --  139  K 4.2  --  3.5  CL 103  --  100*  CO2 24  --  26  GLUCOSE 277*  --  121*  BUN 22*  --  20  CREATININE 1.29* 1.14 1.00  CALCIUM 10.1  --  9.0  GFRNONAA 57* >60 >60  GFRAA >60 >60 >60  ANIONGAP 10  --  13     Hematology Recent Labs Lab 05/25/16 1053 05/26/16 0151 05/27/16 0223  WBC 6.9 7.9 6.2  RBC 4.79 4.49 4.51  HGB 15.3 14.2 14.0  HCT 43.5 40.4 40.7  MCV 90.8 90.0 90.2  MCH 31.9 31.6 31.0  MCHC 35.2 35.1 34.4  RDW 13.7 13.7 13.5  PLT 117* 101* 104*    Cardiac Enzymes Recent Labs Lab 05/25/16 1053 05/25/16 1306 05/25/16 1617  TROPONINI 0.86*  2.64* 4.52*    Recent Labs Lab 05/25/16 0821  TROPIPOC 0.00     BNPNo results for input(s): BNP, PROBNP in the last 168 hours.   DDimer No results for input(s): DDIMER in the last 168 hours.   Radiology    Ct Chest Wo Contrast  Result Date: 05/25/2016 CLINICAL DATA:  Productive cough for 3 weeks.  Atrial fibrillation. EXAM: CT CHEST WITHOUT CONTRAST TECHNIQUE: Multidetector CT imaging of the chest was performed following the standard protocol without IV contrast. COMPARISON:  Chest radiograph 05/25/2016 FINDINGS: Cardiovascular: Normal contour of the great vessels. Advanced calcific atherosclerotic disease of the coronary arteries. Milder atherosclerotic disease and tortuosity of the aorta. The heart is normal in size. There is no pericardial effusion. Mediastinum/Nodes: No enlarged mediastinal or axillary lymph nodes. Thyroid gland, trachea, and esophagus demonstrate no significant findings. Lungs/Pleura: Lungs are clear. No pleural effusion or pneumothorax. Upper  Abdomen: No acute abnormality. 6 cm water density lobulated mass in the left lobe of the liver likely represents a cyst. There is a 2.1 cm circumscribed exophytic mass off of the midpole lateral left renal cortex, which does not satisfy the criteria for a simple cyst by density. 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 IMPRESSION: Advanced calcific atherosclerotic disease of the coronary arteries. Mild atherosclerotic disease of the aorta. No evidence of pulmonary consolidation. 2.1 cm left renal mass, indeterminate by a nonenhanced CT criteria. Further evaluation with renal ultrasound or contrast-enhanced abdominal CT may be considered if found clinically necessary. Electronically Signed   By: Fidela Salisbury M.D.   On: 05/25/2016 13:09   Dg Chest Portable 1 View  Result Date: 05/25/2016 CLINICAL DATA:  66 year old male with central and left-sided chest pain accompanied by weakness and shortness of breath. EXAM: PORTABLE CHEST 1 VIEW COMPARISON:  None FINDINGS: The lungs are clear and negative for focal airspace consolidation, pulmonary edema or suspicious pulmonary nodule. No pleural effusion or pneumothorax. Cardiac and mediastinal contours are within normal limits. No acute fracture or lytic or blastic osseous lesions. The visualized upper abdominal bowel gas pattern is unremarkable. IMPRESSION: Negative chest x-ray. Electronically Signed   By: Jacqulynn Cadet M.D.   On: 05/25/2016 08:35    Cardiac Studies   Cardiac Catheterization: 05/26/2016  The left ventricular systolic function is normal.  LV end diastolic pressure is normal.  The left ventricular ejection fraction is 55-65% by visual estimate.  IMPRESSION: Mr. Matsuo has minor irregularities in his coronary tree and normal LV function. There is no "culprit vessel" to explain his elevated troponin. I suspect this is demand ischemia from his tachyarrhythmia. He is currently in sinus rhythm. This was  removed and a TR band was placed on the right wrist to achieve patent hemostasis. The patient left the lab in stable condition. He will be treated medically with minimal CAD. Consideration will be given oral anticoagulation given his PAF.  Patient Profile     66 y.o. male w/ PMH of HTN, HLD, Type 2 DM, and obesity who presented to [facility] ED on 05/25/2016 for evaluation of palpitations, found to be in a wide-complex tachycardia with conversion to NSR following administration of Adenosine.  Cards consulted for elevated troponin.   Assessment & Plan    1. Elevated Troponin - troponin values this admission have been at 0.86, 2.64, and 4.52. - chest CT on 3/18 showed advanced calcific atherosclerotic disease of coronary arteries. - cardiac catheterization on 3/19 showed a normal EF of 55-65% with minor coronary irregularities and  no culprit lesion to explain his elevated troponin. Enzyme elevation though to be secondary to his tachyarrhythmia. Cath report mentioned possible anticoagulation given "PAF" but by further review of his EKG on 05/25/2016, p-waves are noted.  - continue ASA, statin, and BB.   2. Wide-complex tachycardia - presented with palpitations and found to be in a wide-complex tachycardia. 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  - K+ 3.5. Mg 1.7 on 3/18 (has been replaced). - continue BB therapy.     3. HTN - BP variable at 138/87 - 165/105 in the past 24 hours.  - on Lotensin 20-25mg  daily and Coreg 12.5mg  BID prior to admission. Coreg has been resumed. Can resume Lotensin at time of discharge.   4. HLD - Lipid Panel this admission shows total cholesterol 144, HDL 40, and LDL 53. - on Lovastatin 40mg  daily prior to admission.   5. Type 2 DM - per admitting team.  6. Renal Mass - 2.1 cm left renal mass noted on CT.  - further evaluation per admitting team.   Likely stable for discharge from a  Cardiology perspective. Reviewed lifting restrictions with the patient. Can return to work on 05/30/2016.  Arna Medici , PA-C 6:47 AM 05/27/2016 Pager: [phone]  I have personally seen and examined this patient with Bernerd Pho, PA-C. I agree with the assessment and plan as outlined above. Admitted with SVT with baseline RBBB. Troponin elevated. No chest pain. Cardiac cath with mild CAD. LV function is normal. Elevated troponin likely due to demand ischemia from SVT. Echo pending today to exclude structural heart disease. Would continue ASA, beta blocker and statin. If no major abnormalities on echo, he can be discharged today and we will arrange f/u in my office in several weeks.   Lauree Chandler 8:26 AM 05/27/2016

## 2016-05-27 NOTE — Discharge Summary (Signed)
Physician Discharge Summary  Barry Alexander GGE:[id number] DOB: 1951-02-26 DOA: 05/25/2016  PCP: Orpah Melter, MD  Admit date: 05/25/2016 Discharge date: 05/27/2016   Recommendations for Outpatient Follow-Up:   1. Outpatient follow up for stricter BP control 2. outpatient renal U/S to follow up on 2.1 cm left renal mass   Discharge Diagnosis:   Active Problems:   DM II (diabetes mellitus, type II), controlled ([place])   HTN (hypertension), benign   Productive cough   Non-ST elevation (NSTEMI) myocardial infarction Barry Alexander)   Discharge disposition:  Home.   Discharge Condition: Improved.  Diet recommendation: Low sodium, heart healthy.  Carbohydrate-modified  Wound care: None.   History of Present Illness:   Barry Alexander is a 66 y.o. male  with past history of diabetes and hypertension presents emergency room with tachycardia and diaphoresis. Patient states that over last 3 weeks he's had a productive cough of white sputum. Hasn't taken any medication for this. Has not really improved. Over this time he also has some shortness of breath. Today he was working with no difficulties. He went up to get a piece of equipment and all of a sudden his heart began racing started to poor sweat. Patient works at [employer] and made his way to emergency room.  ED course: EKG confirmed wide complex tachycardia and patient was given adenosine. This was effective and patient's rate became regular.    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 Enzyme elevation though to be secondary to his tachyarrhythmia. Cath report mentioned possible anticoagulation given "PAF" but by further review of his EKG on 05/25/2016, p-waves are noted.     Productive cough Due to 3 weeks duration and patient's dyspnea bedside which he says is been going on for the same time Improved with abx- finish short course  DM A1c: 8 -resume home meds  HLD: Continue statin  Hypertension Resume home meds  2.1 cm left renal mass, indeterminate by a nonenhanced CT criteria.  Further evaluation with renal ultrasound or contrast-enhanced abdominal CT as an outpateint    Medical Consultants:    cards   Discharge Exam:   Vitals:   05/26/16 2130 05/27/16 0615  BP: (!) 160/91 (!) 163/88  Pulse: 83 75  Resp: 18 20  Temp: 97.6 F (36.4 C) 97.8 F (36.6 C)   Vitals:   05/26/16 1636 05/26/16 2111 05/26/16 2130 05/27/16 0615  BP:   (!) 160/91 (!) 163/88  Pulse:  78 83 75  Resp:  18 18 20   Temp:   97.6 F (36.4 C) 97.8 F (36.6 C)  TempSrc:   Oral Oral  SpO2: 94%  96% 95%  Weight:      Height:        Gen:  NAD    The results of significant diagnostics from this hospitalization (including imaging, microbiology, ancillary and laboratory) are listed below for reference.     Procedures and Diagnostic Studies:   Ct Chest Wo Contrast  Result Date: 05/25/2016 CLINICAL DATA:  Productive cough for 3 weeks.  Atrial fibrillation. EXAM: CT CHEST WITHOUT CONTRAST TECHNIQUE: Multidetector CT imaging of the chest was performed following the standard protocol without IV contrast. COMPARISON:  Chest radiograph 05/25/2016 FINDINGS: Cardiovascular: Normal contour of the great vessels. Advanced calcific atherosclerotic  disease of the coronary arteries. Milder atherosclerotic disease and tortuosity of the aorta. The heart is normal in size. There is no pericardial effusion. Mediastinum/Nodes: No enlarged mediastinal or axillary lymph nodes. Thyroid gland, trachea, and esophagus demonstrate no significant findings. Lungs/Pleura: Lungs are clear. No pleural effusion or pneumothorax. Upper Abdomen: No acute abnormality. 6 cm water density lobulated  mass in the left lobe of the liver likely represents a cyst. There is a 2.1 cm circumscribed exophytic mass off of the midpole lateral left renal cortex, which does not satisfy the criteria for a simple cyst by density. 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 IMPRESSION: Advanced calcific atherosclerotic disease of the coronary arteries. Mild atherosclerotic disease of the aorta. No evidence of pulmonary consolidation. 2.1 cm left renal mass, indeterminate by a nonenhanced CT criteria. Further evaluation with renal ultrasound or contrast-enhanced abdominal CT may be considered if found clinically necessary. Electronically Signed   By: Fidela Salisbury M.D.   On: 05/25/2016 13:09   Dg Chest Portable 1 View  Result Date: 05/25/2016 CLINICAL DATA:  66 year old male with central and left-sided chest pain accompanied by weakness and shortness of breath. EXAM: PORTABLE CHEST 1 VIEW COMPARISON:  None FINDINGS: The lungs are clear and negative for focal airspace consolidation, pulmonary edema or suspicious pulmonary nodule. No pleural effusion or pneumothorax. Cardiac and mediastinal contours are within normal limits. No acute fracture or lytic or blastic osseous lesions. The visualized upper abdominal bowel gas pattern is unremarkable. IMPRESSION: Negative chest x-ray. Electronically Signed   By: Jacqulynn Cadet M.D.   On: 05/25/2016 08:35     Labs:   Basic Metabolic Panel:  Recent Labs Lab 05/25/16 0815 05/25/16 1053 05/26/16 0151  NA 137  --  139  K 4.2  --  3.5  CL 103  --  100*  CO2 24  --  26  GLUCOSE 277*  --  121*  BUN 22*  --  20  CREATININE 1.29* 1.14 1.00  CALCIUM 10.1  --  9.0  MG  --  1.7  --   PHOS  --  3.0  --    GFR Estimated Creatinine Clearance: 95.2 mL/min (by C-G formula based on SCr of 1 mg/dL). Liver Function Tests: No results for input(s): AST, ALT, ALKPHOS, BILITOT, PROT, ALBUMIN in the last 168 hours. No results for input(s):  LIPASE, AMYLASE in the last 168 hours. No results for input(s): AMMONIA in the last 168 hours. Coagulation profile  Recent Labs Lab 05/25/16 0815  INR 1.03    CBC:  Recent Labs Lab 05/25/16 0815 05/25/16 1053 05/26/16 0151 05/27/16 0223  WBC 14.4* 6.9 7.9 6.2  HGB 16.5 15.3 14.2 14.0  HCT 48.1 43.5 40.4 40.7  MCV 91.6 90.8 90.0 90.2  PLT 190 117* 101* 104*   Cardiac Enzymes:  Recent Labs Lab 05/25/16 1053 05/25/16 1306 05/25/16 1617  TROPONINI 0.86* 2.64* 4.52*   BNP: Invalid input(s): POCBNP CBG:  Recent Labs Lab 05/26/16 1128 05/26/16 1659 05/26/16 2127 05/27/16 0609 05/27/16 1108  GLUCAP 162* 176* 177* 180* 199*   D-Dimer No results for input(s): DDIMER in the last 72 hours. Hgb A1c  Recent Labs  05/25/16 1056  HGBA1C 8.0*   Lipid Profile  Recent Labs  05/26/16 0151  CHOL 144  HDL 40*  LDLCALC 53  TRIG 254*  CHOLHDL 3.6   Thyroid function studies  Recent Labs  05/26/16 2000  TSH 3.490   Anemia work up No results for input(s): VITAMINB12, FOLATE,  FERRITIN, TIBC, IRON, RETICCTPCT in the last 72 hours. Microbiology Recent Results (from the past 240 hour(s))  Culture, expectorated sputum-assessment     Status: None   Collection Time: 05/25/16  7:38 PM  Result Value Ref Range Status   Specimen Description EXPECTORATED SPUTUM  Final   Special Requests Normal  Final   Sputum evaluation   Final    Sputum specimen not acceptable for testing.  Please recollect.     Report Status 05/25/2016 FINAL  Final     Discharge Instructions:   Discharge Instructions    AMB Referral to Cardiac Rehabilitation - Phase II    Complete by:  As directed    Diagnosis:  NSTEMI   AMB Referral to [place] Management    Complete by:  As directed    Please assign UMR member for post discharge call. Currently at [facility]. Provided brochure for Link to Wellness program for DM management. Please see liaison notes. Thanks. Marthenia Rolling, [place], [facility] Liaison-[phone]   Reason for consult:  Please assign UMR member for post discharge call   Diagnoses of:  Diabetes   Expected date of contact:  1-3 days (reserved for hospital discharges)   Diet - low sodium heart healthy    Complete by:  As directed    Diet Carb Modified    Complete by:  As directed    Discharge instructions    Complete by:  As directed    May return to work on Monday 06/02/16   Increase activity slowly    Complete by:  As directed      Allergies as of 05/27/2016      Reactions   Tobramycin    Made pinkeye flare up more      Medication List    TAKE these medications   aspirin 81 MG chewable tablet Chew 1 tablet (81 mg total) by mouth daily. Start taking on:  05/28/2016   benazepril-hydrochlorthiazide 20-25 MG tablet Commonly known as:  LOTENSIN HCT Take 1 tablet by mouth daily.   carvedilol 12.5 MG tablet Commonly known as:  COREG Take 12.5 mg by mouth 2 (two) times daily.   doxycycline 50 MG capsule Commonly known as:  VIBRAMYCIN Take 1 capsule (50 mg total) by mouth 2 (two) times daily.   glimepiride 2 MG tablet Commonly known as:  AMARYL Take 2 mg by mouth 2 (two) times daily.   JANUMET 50-1000 MG tablet Generic drug:  sitaGLIPtin-metformin Take 1 tablet by mouth 2 (two) times daily.   lovastatin 40 MG tablet Commonly known as:  MEVACOR Take 40 mg by mouth every evening.   multivitamin with minerals Tabs tablet Take 1 tablet by mouth daily.   pioglitazone 15 MG tablet Commonly known as:  ACTOS Take 15 mg by mouth daily.   triamcinolone cream 0.1 % Commonly known as:  KENALOG Apply 1 application topically daily as needed.      Follow-up Information    Ermalinda Barrios, PA-C Follow up on 06/17/2016.   Specialty:  Cardiology Why:  Cardiology Hospital Follow-Up on 06/17/2016 at 1:00PM (Dr. Camillia Herter Physician Assistant).  Contact information: [address] [place]  [address] [phone]        Orpah Melter, MD Follow up in 1 week(s).   Specialty:  Family Medicine Contact information: [place]  [id number] [phone]            Time coordinating discharge: 25 min  Signed:  [employer]   [facility] 05/27/2016, 2:02 PM

## 2016-05-27 NOTE — Progress Notes (Signed)
  Echocardiogram 2D Echocardiogram has been performed.  Barry Alexander 05/27/2016, 11:01 AM

## 2016-05-27 NOTE — Progress Notes (Signed)
Iv removed. No issues at present. c/d instructions reviewed. Awaiting wheelchair.  Lennie Dunnigan, Mervin Kung RN

## 2016-05-29 DIAGNOSIS — I10 Essential (primary) hypertension: Secondary | ICD-10-CM | POA: Diagnosis not present

## 2016-05-29 DIAGNOSIS — E782 Mixed hyperlipidemia: Secondary | ICD-10-CM | POA: Diagnosis not present

## 2016-05-29 DIAGNOSIS — E119 Type 2 diabetes mellitus without complications: Secondary | ICD-10-CM | POA: Diagnosis not present

## 2016-05-29 MED FILL — BENAZEPRIL-HCTZ 20-25 MG TA: 20-25 | 90 days supply | Qty: 90 | Fill #0

## 2016-05-29 MED FILL — JANUMET 50-1,000 MG TABLET: 50-1000 | 30 days supply | Qty: 60 | Fill #0

## 2016-05-29 MED FILL — GLIMEPIRIDE 2 MG TABLET: 2 | 90 days supply | Qty: 360 | Fill #0

## 2016-05-29 MED FILL — PIOGLITAZONE HCL 15 MG TAB: 15 | 90 days supply | Qty: 90 | Fill #0

## 2016-06-02 ENCOUNTER — Other Ambulatory Visit: Payer: Self-pay | Admitting: *Deleted

## 2016-06-02 NOTE — Patient Outreach (Signed)
[facility]) Care Management  06/02/2016  Barry Alexander 1950-04-01 [id number]   Subjective: Telephone call to patient's home /mobile number, spoke with wife, states patient not currently available, left HIPAA compliant message with wife for patient, and requested call back.   Objective: Per [facility] point of care tool and chart review, patient hospitalized 05/25/16 -05/27/16 for tachycardia.   Patient also has a history of diabetes, hypertension, and Non-ST elevation (NSTEMI) myocardial infarction.     Assessment: Received UMR Transition of care referral on 05/26/16.   Transition of care follow up pending patient contact.     Plan: RNCM will call patient for 2nd telephone outreach attempt, transition of care follow up, within 10 business days if no return call.    Raneen Jaffer H. Annia Friendly, BSN, [place] Management [facility] Telephonic CM Phone: [phone] Fax: [phone]

## 2016-06-03 ENCOUNTER — Encounter: Payer: Self-pay | Admitting: *Deleted

## 2016-06-03 ENCOUNTER — Other Ambulatory Visit: Payer: Self-pay | Admitting: *Deleted

## 2016-06-03 NOTE — Patient Outreach (Signed)
[facility]) Care Management  06/03/2016  Barry Alexander Aug 09, 1950 [id number]   Subjective: Telephone call to patient's home / mobile number times 2, spoke with patient, and HIPAA verified.   Discussed [facility] Care Management UMR Transition of care follow up and Link to Wellness program.   Patient voices understanding, is in agreement to complete follow up, and declines Link to Wellness program at this time.  Patient states he is doing well,  A1C is 7.2, heart is feeling, has had follow up with primary MD, and is feeling much better.  States he has returned work and did not need to utilize family medical leave act [employer]).   Patient states he does not have any transition of care, care coordination, disease management, disease monitoring, transportation, community resource, or pharmacy needs at this time.   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  Objective: Per [facility] point of care tool and chart review, patient hospitalized 05/25/16 -05/27/16 for tachycardia.   Patient also has a history of diabetes, hypertension, and Non-ST elevation (NSTEMI) myocardial infarction.     Assessment: Received UMR Transition of care referral on 05/26/16. Transition of care follow up completed, no care management needs, and will proceed with case closure.     Plan: RNCM will send patient successful outreach letter, [facility] pamphlet, and magnet. RNCM will send case closure due to follow up completed / no care management needs request to [place] [facility] [place] Management.    Sharni Negron H. Annia Friendly, BSN, [place] Management [facility] Telephonic CM Phone: [phone] Fax: [phone]

## 2016-06-17 ENCOUNTER — Ambulatory Visit (INDEPENDENT_AMBULATORY_CARE_PROVIDER_SITE_OTHER): Payer: 59 | Admitting: Physician Assistant

## 2016-06-17 ENCOUNTER — Encounter: Payer: Self-pay | Admitting: Physician Assistant

## 2016-06-17 VITALS — BP 144/86 | HR 78 | Ht 69.0 in | Wt 259.0 lb

## 2016-06-17 DIAGNOSIS — N2889 Other specified disorders of kidney and ureter: Secondary | ICD-10-CM | POA: Diagnosis not present

## 2016-06-17 DIAGNOSIS — I471 Supraventricular tachycardia: Secondary | ICD-10-CM | POA: Diagnosis not present

## 2016-06-17 DIAGNOSIS — I214 Non-ST elevation (NSTEMI) myocardial infarction: Secondary | ICD-10-CM | POA: Diagnosis not present

## 2016-06-17 DIAGNOSIS — E1165 Type 2 diabetes mellitus with hyperglycemia: Secondary | ICD-10-CM

## 2016-06-17 DIAGNOSIS — I4719 Other supraventricular tachycardia: Secondary | ICD-10-CM | POA: Insufficient documentation

## 2016-06-17 DIAGNOSIS — I10 Essential (primary) hypertension: Secondary | ICD-10-CM | POA: Diagnosis not present

## 2016-06-17 MED ORDER — CARVEDILOL 12.5 MG PO TABS: 18.7500 mg | ORAL_TABLET | Freq: Two times a day (BID) | ORAL | 3 refills | Status: DC

## 2016-06-17 NOTE — Patient Instructions (Addendum)
Medication Instructions:  1. INCREASE COREG TO 18.75 MG TWICE DAILY (THIS WILL BE 1 AND 1/2 TABS TWICE DAILY); NEW RX SENT IN WITH THE NEW DIRECTIONS  Labwork: NONE ORDERED  Testing/Procedures: NONE ORDERED  Follow-Up: DR. Angelena Form ON 08/28/16 @ 11:20   Any Other Special Instructions Will Be Listed Below (If Applicable).     If you need a refill on your cardiac medications before your next appointment, please call your pharmacy.

## 2016-06-17 NOTE — Progress Notes (Signed)
Cardiology Office Note    Date:  06/17/2016   ID:  Barry Alexander, DOB Nov 21, 1950, MRN [id number]  PCP:  Orpah Melter, MD  Cardiologist: Dr. Angelena Form  Chief Complaint  Patient presents with  . Follow-up    History of Present Illness:  Barry Alexander is a 66 y.o. 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 Troponins were elevated up to 4.52. Chest CT's 05/25/16 showed advance calcific atherosclerotic disease of the coronary arteries. Cardiac catheterization 05/26/16 showed normal EF 55-65% with minor coronary irregularities and no culprit lesions to explain his elevated troponins. This was felt secondary to his tachyarrhythmias. 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 Potassium was 3.5 and magnesium 1.7 and was replaced. He was treated with beta blocker. Patient's blood pressure was also elevated and he was started on Coreg and Lotensin. 2-D echo 05/27/16 normal LV EF 50-55% with moderate LVH and grade 2 DD, left atrium mildly dilated.  Patient comes in today for f/u. Denies and chest pain, palpitations, dyspnea, dyspnea on exertion,dizziness or presyncope. 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    Past Medical History:  Diagnosis Date  . DM (diabetes mellitus) ([place])   . HTN (hypertension)     Past Surgical History:  Procedure Laterality Date  . LEFT HEART CATH AND CORONARY ANGIOGRAPHY N/A 05/26/2016   Procedure: Left Heart Cath and Coronary Angiography;  Surgeon: Lorretta Harp, MD;  Location: [facility];  Service: Cardiovascular;  Laterality: N/A;  . NO PAST SURGERIES      Current Medications: Outpatient  Medications Prior to Visit  Medication Sig Dispense Refill  . aspirin 81 MG chewable tablet Chew 1 tablet (81 mg total) by mouth daily.    . benazepril-hydrochlorthiazide (LOTENSIN HCT) 20-25 MG tablet Take 1 tablet by mouth daily.  0  . glimepiride (AMARYL) 2 MG tablet Take 2 mg by mouth 2 (two) times daily.  1  . JANUMET 50-1000 MG tablet Take 1 tablet by mouth 2 (two) times daily.  1  . lovastatin (MEVACOR) 40 MG tablet Take 40 mg by mouth every evening.  1  . Multiple Vitamin (MULTIVITAMIN WITH MINERALS) TABS tablet Take 1 tablet by mouth daily.    . pioglitazone (ACTOS) 15 MG tablet Take 15 mg by mouth daily.  1  . triamcinolone cream (KENALOG) 0.1 % Apply 1 application topically daily as needed.  1  . carvedilol (COREG) 12.5 MG tablet Take 12.5 mg by mouth 2 (two) times daily.  1  . doxycycline (VIBRAMYCIN) 50 MG capsule Take 1 capsule (50 mg total) by mouth 2 (two) times daily. (Patient not taking: Reported on 06/03/2016) 6 capsule 0   No facility-administered medications prior to visit.      Allergies:   Tobramycin   Social History   Social History  . Marital status: Married    Spouse name: N/A  . Number of children: N/A  . Years of education: N/A   Social History Main Topics  . Smoking status: Never Smoker  . Smokeless tobacco: Never Used  . Alcohol use None  . Drug use: Unknown  . Sexual activity:  Not Asked   Other Topics Concern  . None   Social History Narrative  . None     Family History:  The patient's   family history includes Heart disease (age of onset: 22) in his brother.   ROS:   Please see the history of present illness.    Review of Systems  Constitution: Negative.  HENT: Negative.   Cardiovascular: Negative.   Respiratory: Negative.   Endocrine: Negative.   Hematologic/Lymphatic: Bruises/bleeds easily.  Musculoskeletal: Negative.   Gastrointestinal: Negative.   Genitourinary: Negative.   Neurological: Negative.    All other systems reviewed  and are negative.   PHYSICAL EXAM:   VS:  BP (!) 144/86 (BP Location: Right Arm, Patient Position: Sitting)   Pulse 78   Ht 5\' 9"  (1.753 m)   Wt 259 lb (117.5 kg)   BMI 38.25 kg/m   Physical Exam  GEN: Obese, in no acute distress  Neck: no JVD, carotid bruits, or masses Cardiac:RRR; no murmurs, rubs, or gallops  Respiratory:  clear to auscultation bilaterally, normal work of breathing GI: soft, nontender, nondistended, + BS TZG:YFVCB arm at cath site without hematoma or hemorrhage, good radial and brachial pulses without cyanosis, clubbing, or edema, Good distal pulses bilaterally Psych: euthymic mood, full affect  Wt Readings from Last 3 Encounters:  06/17/16 259 lb (117.5 kg)  05/25/16 256 lb 12.8 oz (116.5 kg)      Studies/Labs Reviewed:   EKG:  EKG is  ordered today.  The ekg ordered today demonstrates Normal sinus rhythm with right bundle branch block  Recent Labs: 05/25/2016: Magnesium 1.7 05/26/2016: BUN 20; Creatinine, Ser 1.00; Potassium 3.5; Sodium 139; TSH 3.490 05/27/2016: Hemoglobin 14.0; Platelets 104   Lipid Panel    Component Value Date/Time   CHOL 144 05/26/2016 0151   TRIG 254 (H) 05/26/2016 0151   HDL 40 (L) 05/26/2016 0151   CHOLHDL 3.6 05/26/2016 0151   VLDL 51 (H) 05/26/2016 0151   LDLCALC 53 05/26/2016 0151    Additional studies/ records that were reviewed today include:  2-D echo 05/27/16 Study Conclusions   - Left ventricle: The cavity size was normal. Wall thickness was   increased in a pattern of moderate LVH. Systolic function was   normal. The estimated ejection fraction was in the range of 50%   to 55%. Wall motion was normal; there were no regional wall   motion abnormalities. Features are consistent with a pseudonormal   left ventricular filling pattern, with concomitant abnormal   relaxation and increased filling pressure (grade 2 diastolic   dysfunction). - Aortic valve: Mildly calcified annulus. - Mitral valve: Mildly to  moderately calcified annulus. Mildly   thickened leaflets . - Left atrium: The atrium was mildly dilated.   Cardiac Catheterization: 05/26/2016  The left ventricular systolic function is normal.  LV end diastolic pressure is normal.  The left ventricular ejection fraction is 55-65% by visual estimate.   IMPRESSION: Mr. Kadlec has minor irregularities in his coronary tree and normal LV function. There is no "culprit vessel" to explain his elevated troponin. I suspect this is demand ischemia from his tachyarrhythmia. He is currently in sinus rhythm. This was removed and a TR band was placed on the right wrist to achieve patent hemostasis. The patient left the lab in stable condition. He will be treated medically with minimal CAD. Consideration will be given oral anticoagulation given his PAF.       ASSESSMENT:    1. Non-ST elevation (NSTEMI) myocardial  infarction ([place])   2. Left renal mass   3. AVNRT (AV nodal re-entry tachycardia) ([place])   4. HTN (hypertension), benign   5. Controlled type 2 diabetes mellitus with hyperglycemia, unspecified long term insulin use status (HCC)      PLAN:  In order of problems listed above:  NSTEMI Felt secondary to demand ischemia from AVNRT, minimal CAD on cardiac catheterization and no culprit lesion. Managed medically. Continue aspirin/Mevacor/Coreg. F/U with Dr. Angelena Form in 2 months.  AVNRT heart rate 78 today. Has had no further symptoms. Will increase Coreg to 18.75 mg twice a day.  HTN mostly controlled on Lotensin HCTZ. Patient says he has whitecoat syndrome.  Diabetes mellitus managed by primary care  Left renal mass found incidentally on CT scan. Recommend ultrasound or contrasted CT. I offered to order this but Patient wants to follow-up with primary care to have this done.       Medication Adjustments/Labs and Tests Ordered: Current medicines are reviewed at length with the patient today.  Concerns regarding medicines are outlined  above.  Medication changes, Labs and Tests ordered today are listed in the Patient Instructions below. Patient Instructions  Medication Instructions:  1. INCREASE COREG TO 18.75 MG TWICE DAILY (THIS WILL BE 1 AND 1/2 TABS TWICE DAILY); NEW RX SENT IN WITH THE NEW DIRECTIONS  Labwork: NONE ORDERED  Testing/Procedures: NONE ORDERED  Follow-Up: DR. Angelena Form   Any Other Special Instructions Will Be Listed Below (If Applicable).     If you need a refill on your cardiac medications before your next appointment, please call your pharmacy.      Signed, Ermalinda Barrios, PA-C  06/17/2016 1:01 PM    [facility] [place], [place], [place]  [address] Phone: [phone]; Fax: [phone]

## 2016-06-18 ENCOUNTER — Other Ambulatory Visit: Payer: Self-pay | Admitting: Diagnostic Radiology

## 2016-06-18 DIAGNOSIS — N2889 Other specified disorders of kidney and ureter: Secondary | ICD-10-CM

## 2016-06-19 ENCOUNTER — Ambulatory Visit (INDEPENDENT_AMBULATORY_CARE_PROVIDER_SITE_OTHER): Payer: 59

## 2016-06-19 DIAGNOSIS — N281 Cyst of kidney, acquired: Secondary | ICD-10-CM

## 2016-06-19 DIAGNOSIS — N2889 Other specified disorders of kidney and ureter: Secondary | ICD-10-CM | POA: Diagnosis not present

## 2016-07-10 MED FILL — TRIAMCINOLONE 0.1% CREAM: 0.1 | 10 days supply | Qty: 30 | Fill #0

## 2016-07-10 MED FILL — JANUMET 50-1,000 MG TABLET: 50-1000 | 30 days supply | Qty: 60 | Fill #1

## 2016-07-10 MED FILL — CARVEDILOL 12.5 MG TABLET: 12.5 | 90 days supply | Qty: 180 | Fill #1

## 2016-08-12 MED FILL — LOVASTATIN 40 MG TABLET: 40 | 90 days supply | Qty: 90 | Fill #1

## 2016-08-12 MED FILL — JANUMET 50-1,000 MG TABLET: 50-1000 | 30 days supply | Qty: 60 | Fill #2

## 2016-08-28 ENCOUNTER — Encounter: Payer: Self-pay | Admitting: Cardiovascular Disease

## 2016-08-28 ENCOUNTER — Ambulatory Visit (INDEPENDENT_AMBULATORY_CARE_PROVIDER_SITE_OTHER): Payer: 59 | Admitting: Cardiovascular Disease

## 2016-08-28 VITALS — BP 118/70 | HR 75 | Ht 69.0 in | Wt 258.2 lb

## 2016-08-28 DIAGNOSIS — I10 Essential (primary) hypertension: Secondary | ICD-10-CM | POA: Diagnosis not present

## 2016-08-28 DIAGNOSIS — I471 Supraventricular tachycardia: Secondary | ICD-10-CM

## 2016-08-28 NOTE — Progress Notes (Signed)
Chief Complaint  Patient presents with  . Follow-up    SVT   History of Present Illness: 66 yo male with history of HTN, HLD, DM and SVT who is here today for cardiac follow up. He was admitted to [facility] March 2018 with palpitations and was found to have a wide complex tachycardia which converted to sinus with adenosine. 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 Troponin was elevated. Cardiac cath 05/26/16 with minimal CAD. Echo 05/27/16 with normal LV systolic function, moderate LVH. He was started on a beta blocker and ARB.   He is here today for follow up. The patient denies any chest pain, dyspnea, palpitations, lower extremity edema, orthopnea, PND, dizziness, near syncope or syncope.   Primary Care Physician: Orpah Melter, MD  Past Medical History:  Diagnosis Date  . DM (diabetes mellitus) ([place])   . HTN (hypertension)   . SVT (supraventricular tachycardia) (HCC)     Past Surgical History:  Procedure Laterality Date  . LEFT HEART CATH AND CORONARY ANGIOGRAPHY N/A 05/26/2016   Procedure: Left Heart Cath and Coronary Angiography;  Surgeon: Lorretta Harp, MD;  Location: [place] [facility];  Service: Cardiovascular;  Laterality: N/A;  . NO PAST SURGERIES      Current Outpatient Prescriptions  Medication Sig Dispense Refill  . aspirin 81 MG chewable tablet Chew 1 tablet (81 mg total) by mouth daily.    . benazepril-hydrochlorthiazide (LOTENSIN HCT) 20-25 MG tablet Take 1 tablet by mouth daily.  0  . carvedilol (COREG) 12.5 MG tablet Take 1.5 tablets (18.75 mg total) by mouth 2 (two) times daily. 270 tablet 3  . glimepiride (AMARYL) 2 MG tablet Take 2 mg by mouth 2 (two) times daily.  1  . JANUMET 50-1000 MG tablet Take 1 tablet by mouth 2 (two) times daily.  1  . lovastatin (MEVACOR) 40 MG tablet Take 40 mg by mouth every evening.  1  . Multiple Vitamin (MULTIVITAMIN WITH MINERALS) TABS tablet Take 1 tablet by mouth daily.    . pioglitazone (ACTOS) 15 MG tablet Take  15 mg by mouth daily.  1  . triamcinolone cream (KENALOG) 0.1 % Apply 1 application topically daily as needed.  1   No current facility-administered medications for this visit.     Allergies  Allergen Reactions  . Tobramycin     Made pinkeye flare up more    Social History   Social History  . Marital status: Married    Spouse name: N/A  . Number of children: N/A  . Years of education: N/A   Occupational History  . Not on file.   Social History Main Topics  . Smoking status: Never Smoker  . Smokeless tobacco: Never Used  . Alcohol use Not on file  . Drug use: Unknown  . Sexual activity: Not on file   Other Topics Concern  . Not on file   Social History Narrative  . No narrative on file    Family History  Problem Relation Age of Onset  . Heart disease Brother 28       CABG  . Heart attack Neg Hx     Review of Systems:  As stated in the HPI and otherwise negative.   BP 118/70   Pulse 75   Ht 5\' 9"  (1.753 m)   Wt 258 lb 3.2 oz (117.1 kg)   SpO2 93%   BMI 38.13 kg/m   Physical Examination: General: Well developed, well nourished, NAD  HEENT: OP clear, mucus membranes moist  SKIN: warm, dry. No rashes. Neuro: No focal deficits  Musculoskeletal: Muscle strength 5/5 all ext  Psychiatric: Mood and affect normal  Neck: No JVD, no carotid bruits, no thyromegaly, no lymphadenopathy.  Lungs:Clear bilaterally, no wheezes, rhonci, crackles Cardiovascular: Regular rate and rhythm. No murmurs, gallops or rubs. Abdomen:Soft. Bowel sounds present. Non-tender.  Extremities: No lower extremity edema. Pulses are 2 + in the bilateral DP/PT.  Echo 05/27/16: - Left ventricle: The cavity size was normal. Wall thickness was   increased in a pattern of moderate LVH. Systolic function was   normal. The estimated ejection fraction was in the range of 50%   to 55%. Wall motion was normal; there were no regional wall   motion abnormalities. Features are consistent with a  pseudonormal   left ventricular filling pattern, with concomitant abnormal   relaxation and increased filling pressure (grade 2 diastolic   dysfunction). - Aortic valve: Mildly calcified annulus. - Mitral valve: Mildly to moderately calcified annulus. Mildly   thickened leaflets . - Left atrium: The atrium was mildly dilated.   Assessment and Plan:   1. SVT: He has had no palpitations. Continue beta blocker. I have reviewed vagal maneuvers.   2. HTN: BP controlled. No changes   Current medicines are reviewed at length with the patient today.  The patient does not have concerns regarding medicines.  The following changes have been made:  no change  Labs/ tests ordered today include:  No orders of the defined types were placed in this encounter.  Disposition:   FU with me in 12 months  Signed, Lauree Chandler, MD 08/28/2016 11:58 AM    [facility] [address], [place], [place]  [address] Phone: [phone]; Fax: [phone]

## 2016-08-28 NOTE — Patient Instructions (Signed)
Medication Instructions:  Your physician recommends that you continue on your current medications as directed. Please refer to the Current Medication list given to you today.   Labwork: none  Testing/Procedures: none  Follow-Up: Your physician recommends that you schedule a follow-up appointment in: 12 months. Please call our office in about 9 months to schedule this appointment.     Any Other Special Instructions Will Be Listed Below (If Applicable).     If you need a refill on your cardiac medications before your next appointment, please call your pharmacy.                       Supraventricular Tachycardia, Adult Supraventricular tachycardia (SVT) is a kind of abnormal heartbeat. It makes your heart beat very fast and then beat at a normal speed. A normal heart beats 60-100 times a minute. This condition can make your heart beat more than 150 times a minute. Times of having a fast heartbeat (episodes) can be scary, but they are usually not dangerous. They can lead to problems if:  They happen often.  They last a long time.  Symptoms of this condition include:  A pounding heart.  A feeling that your heart is skipping beats (palpitations).  Weakness.  Trouble getting enough air (shortness of breath).  Pain or tightness in your chest.  Feeling like you are going to pass out (light-headedness).  Feeling worried or nervous (anxiety).  Dizziness.  Sweating.  Feeling sick to your stomach (nausea).  Passing out (fainting).  Tiredness.  Sometimes, there are no symptoms. Follow these instructions at home: Stress  Avoid things that make you feel stressed.  Find out what helps you feel less stressed. Try: ? Doing a relaxing activity, like yoga, meditation, or being out in nature. ? Listening to relaxing music. ? Doing relaxation techniques, like deep breathing. ? Taking steps to be healthy. These include getting lots of sleep,  exercising, and eating a balanced diet. ? Talking with a mental health doctor. Sleep  Try to get at least 7 hours of sleep each night. Tobacco and nicotine  Do not use anything that has nicotine or tobacco, such as cigarettes and e-cigarettes. If you need help quitting, ask your doctor. Alcohol  If alcohol gives you a fast heartbeat, do not drink alcohol.  If alcohol does not seem to give you a fast heartbeat, limit your alcohol. For nonpregnant women, this means no more than 1 drink a day. For men, this means no more than 2 drinks a day. "One drink" means one of these: ? 12 oz of beer. ? 5 oz of wine. ? 1 oz of hard liquor. Caffeine  If caffeine gives you a fast heartbeat, do not eat, drink, or use anything with caffeine in it.  If caffeine does not seem to give you a fast heartbeat, limit how much caffeine you eat, drink, or use. Stimulant drugs  Do not use stimulant drugs. These are drugs like cocaine or methamphetamine. If you need help quitting, ask your doctor. General instructions  Stay at a healthy weight.  Exercise regularly. Ask your doctor to suggest some good activities for you. Try one of these options: ? 150 minutes a week of gentle exercise, like walking or yoga. ? 75 minutes a week of exercise that is very active, like running or swimming. ? A combination of gentle exercise and very active exercise.  Do home treatments to slow down your heartbeat as told by your  doctor.  Take over-the-counter and prescription medicines only as told by your doctor. Contact a doctor if:  You have a fast heartbeat more often.  Times of having a fast heartbeat last longer than before.  Your home treatments to slow down your heartbeat do not help.  You have new symptoms. Get help right away if:  You have chest pain.  Your symptoms get worse.  You have trouble breathing.  Your heart beats very fast for more than 20 minutes.  You pass out (faint). These symptoms may  be an emergency. Do not wait to see if the symptoms will go away. Get medical help right away. Call your local emergency services (911 in the [place] Do not drive yourself to the hospital. This information is not intended to replace advice given to you by your health care provider. Make sure you discuss any questions you have with your health care provider. Document Released: 02/24/2005 Document Revised: 11/01/2015 Document Reviewed: 11/01/2015 Elsevier Interactive Patient Education  2017 [employer].

## 2016-08-29 MED FILL — BENAZEPRIL-HCTZ 20-25 MG TA: 20-25 | 90 days supply | Qty: 90 | Fill #1

## 2016-09-11 MED FILL — JANUMET 50-1,000 MG TABLET: 50-1000 | 30 days supply | Qty: 60 | Fill #3

## 2016-09-11 MED FILL — PIOGLITAZONE HCL 15 MG TAB: 15 | 90 days supply | Qty: 90 | Fill #1

## 2016-09-11 MED FILL — GLIMEPIRIDE 2 MG TABLET: 2 | 90 days supply | Qty: 360 | Fill #1

## 2016-09-12 ENCOUNTER — Other Ambulatory Visit: Payer: Self-pay | Admitting: Cardiovascular Disease

## 2016-09-12 MED ORDER — CARVEDILOL 12.5 MG PO TABS: 18.7500 mg | ORAL_TABLET | Freq: Two times a day (BID) | ORAL | 3 refills | Status: AC

## 2016-09-12 MED FILL — CARVEDILOL 12.5 MG TABLET: 12.5 | 90 days supply | Qty: 270 | Fill #0

## 2016-09-12 NOTE — Telephone Encounter (Signed)
Rx had to be resent, because pharmacy stated that they never received it.

## 2016-10-10 MED FILL — JANUMET 50-1,000 MG TABLET: 50-1000 | 30 days supply | Qty: 60 | Fill #4

## 2016-10-23 MED FILL — LOVASTATIN 40 MG TABLET: 40 | 90 days supply | Qty: 90 | Fill #0

## 2016-11-13 MED FILL — JANUMET 50-1,000 MG TABLET: 50-1000 | 30 days supply | Qty: 60 | Fill #5

## 2016-11-28 MED FILL — BENAZEPRIL-HCTZ 20-25 MG TA: 20-25 | 90 days supply | Qty: 90 | Fill #0

## 2016-11-28 MED FILL — TRIAMCINOLONE 0.1% CREAM: 0.1 | 10 days supply | Qty: 30 | Fill #1

## 2016-12-01 DIAGNOSIS — I700 Atherosclerosis of aorta: Secondary | ICD-10-CM | POA: Diagnosis not present

## 2016-12-01 DIAGNOSIS — Z6839 Body mass index (BMI) 39.0-39.9, adult: Secondary | ICD-10-CM | POA: Diagnosis not present

## 2016-12-01 DIAGNOSIS — Z1211 Encounter for screening for malignant neoplasm of colon: Secondary | ICD-10-CM | POA: Diagnosis not present

## 2016-12-01 DIAGNOSIS — Z7984 Long term (current) use of oral hypoglycemic drugs: Secondary | ICD-10-CM | POA: Diagnosis not present

## 2016-12-01 DIAGNOSIS — I10 Essential (primary) hypertension: Secondary | ICD-10-CM | POA: Diagnosis not present

## 2016-12-01 DIAGNOSIS — E782 Mixed hyperlipidemia: Secondary | ICD-10-CM | POA: Diagnosis not present

## 2016-12-01 DIAGNOSIS — E119 Type 2 diabetes mellitus without complications: Secondary | ICD-10-CM | POA: Diagnosis not present

## 2016-12-01 DIAGNOSIS — I2510 Atherosclerotic heart disease of native coronary artery without angina pectoris: Secondary | ICD-10-CM | POA: Diagnosis not present

## 2016-12-10 MED FILL — PIOGLITAZONE HCL 30 MG TAB: 30 | 90 days supply | Qty: 90 | Fill #0

## 2016-12-10 MED FILL — CARVEDILOL 12.5 MG TABLET: 12.5 | 90 days supply | Qty: 270 | Fill #1

## 2016-12-10 MED FILL — JANUMET 50-1,000 MG TABLET: 50-1000 | 30 days supply | Qty: 60 | Fill #0

## 2016-12-16 MED FILL — GLIMEPIRIDE 2 MG TABLET: 2 | 90 days supply | Qty: 360 | Fill #0

## 2017-01-14 MED FILL — JANUMET 50-1,000 MG TABLET: 50-1000 | 30 days supply | Qty: 60 | Fill #1

## 2017-02-12 MED FILL — JANUMET 50-1,000 MG TABLET: 50-1000 | 30 days supply | Qty: 60 | Fill #2

## 2017-02-12 MED FILL — LOVASTATIN 40 MG TABS: 40 | 90 days supply | Qty: 90 | Fill #1

## 2017-02-13 ENCOUNTER — Encounter (HOSPITAL_COMMUNITY): Payer: Self-pay

## 2017-02-13 ENCOUNTER — Emergency Department (HOSPITAL_COMMUNITY)
Admission: EM | Admit: 2017-02-13 | Discharge: 2017-02-13 | Disposition: A | Discharge status: Home / Self Care | Payer: 59 | Attending: Emergency Medicine | Admitting: Emergency Medicine

## 2017-02-13 DIAGNOSIS — Z7982 Long term (current) use of aspirin: Secondary | ICD-10-CM | POA: Diagnosis not present

## 2017-02-13 DIAGNOSIS — E119 Type 2 diabetes mellitus without complications: Secondary | ICD-10-CM | POA: Diagnosis not present

## 2017-02-13 DIAGNOSIS — I10 Essential (primary) hypertension: Secondary | ICD-10-CM | POA: Diagnosis not present

## 2017-02-13 DIAGNOSIS — Z79899 Other long term (current) drug therapy: Secondary | ICD-10-CM | POA: Insufficient documentation

## 2017-02-13 DIAGNOSIS — I471 Supraventricular tachycardia: Secondary | ICD-10-CM | POA: Diagnosis not present

## 2017-02-13 DIAGNOSIS — R002 Palpitations: Secondary | ICD-10-CM | POA: Diagnosis present

## 2017-02-13 DIAGNOSIS — Z7984 Long term (current) use of oral hypoglycemic drugs: Secondary | ICD-10-CM | POA: Diagnosis not present

## 2017-02-13 LAB — MAGNESIUM: Magnesium: 1.5 mg/dL — ABNORMAL LOW (ref 1.7–2.4)

## 2017-02-13 LAB — BASIC METABOLIC PANEL
ANION GAP: 15 (ref 5–15)
BUN: 19 mg/dL (ref 6–20)
CALCIUM: 9.5 mg/dL (ref 8.9–10.3)
CO2: 21 mmol/L — AB (ref 22–32)
CREATININE: 1.53 mg/dL — AB (ref 0.61–1.24)
Chloride: 99 mmol/L — ABNORMAL LOW (ref 101–111)
GFR calc Af Amer: 54 mL/min — ABNORMAL LOW (ref >60)
GFR, EST NON AFRICAN AMERICAN: 47 mL/min — AB (ref >60)
GLUCOSE: 262 mg/dL — AB (ref 65–99)
Potassium: 4.1 mmol/L (ref 3.5–5.1)
Sodium: 135 mmol/L (ref 135–145)

## 2017-02-13 LAB — CBC WITH DIFFERENTIAL/PLATELET
BASOS ABS: 0 10*3/uL (ref 0.0–0.1)
Basophils Relative: 0 %
EOS PCT: 1 %
Eosinophils Absolute: 0.1 10*3/uL (ref 0.0–0.7)
HEMATOCRIT: 48.7 % (ref 39.0–52.0)
Hemoglobin: 17 g/dL (ref 13.0–17.0)
LYMPHS PCT: 30 %
Lymphs Abs: 3.1 10*3/uL (ref 0.7–4.0)
MCH: 32.2 pg (ref 26.0–34.0)
MCHC: 34.9 g/dL (ref 30.0–36.0)
MCV: 92.2 fL (ref 78.0–100.0)
MONO ABS: 1.2 10*3/uL — AB (ref 0.1–1.0)
MONOS PCT: 12 %
Neutro Abs: 5.9 10*3/uL (ref 1.7–7.7)
Neutrophils Relative %: 57 %
PLATELETS: 173 10*3/uL (ref 150–400)
RBC: 5.28 MIL/uL (ref 4.22–5.81)
RDW: 13.4 % (ref 11.5–15.5)
WBC: 10.3 10*3/uL (ref 4.0–10.5)

## 2017-02-13 LAB — TROPONIN I: Troponin I: <0.03 ng/mL (ref <0.03)

## 2017-02-13 MED ORDER — ADENOSINE 6 MG/2ML IV SOLN
INTRAVENOUS | Status: AC
  Filled 2017-02-13: qty 6

## 2017-02-13 MED ORDER — ADENOSINE 6 MG/2ML IV SOLN
12.0000 mg | Freq: Once | INTRAVENOUS | Status: AC
  Administered 2017-02-13: 12 mg via INTRAVENOUS

## 2017-02-13 MED ORDER — MAGNESIUM SULFATE 2 GM/50ML IV SOLN
2.0000 g | Freq: Once | INTRAVENOUS | Status: AC
  Administered 2017-02-13: 2 g via INTRAVENOUS
  Filled 2017-02-13: qty 50

## 2017-02-13 MED ORDER — ASPIRIN 81 MG PO CHEW: 324.0000 mg | CHEWABLE_TABLET | Freq: Once | ORAL | Status: DC

## 2017-02-13 MED ORDER — CARVEDILOL 6.25 MG PO TABS
18.2500 mg | ORAL_TABLET | Freq: Once | ORAL | Status: DC
  Filled 2017-02-13: qty 1

## 2017-02-13 NOTE — ED Triage Notes (Signed)
Pt states he felt like his heart was racing an hour ago, hx of SVT. HR 160 diaphoretic, SOB

## 2017-02-13 NOTE — ED Provider Notes (Addendum)
[facility] EMERGENCY DEPARTMENT Provider Note   CSN: [id number] Arrival date & time: 02/13/17  1937     History   Chief Complaint Chief Complaint  Patient presents with  . Tachycardia    HPI Barry Alexander is a 66 y.o. male. Chief complaint is palpitations  HPI:  66 year old male. He awakened at 5 this morning. Shortly after started feeling out the patient is in his chest. He's had 2 past episodes similar to this. About 6 months, presented here and I saw him in the emergency room with SVT and he converted with adenosine. He's had one other episode that resolved at home with coughing.  With his last admission he had normal echo, he had no coronary artery disease on heart cath.  He denies shortness of breath or chest pain. He states he did get sweaty this morning. He is immediately aware of his symptoms.  He was seen by Dr. Julianne Handler, started on ARB, and Coreg. He takes 18.75 mg of Coreg daily.  Past Medical History:  Diagnosis Date  . DM (diabetes mellitus) ([place])   . HTN (hypertension)   . SVT (supraventricular tachycardia) [facility]     Patient Active Problem List   Diagnosis Date Noted  . AVNRT (AV nodal re-entry tachycardia) ([place]) 06/17/2016  . Left renal mass 06/17/2016  . Non-ST elevation (NSTEMI) myocardial infarction ([place])   . AF (paroxysmal atrial fibrillation) ([place]) 05/25/2016  . DM II (diabetes mellitus, type II), controlled ([place]) 05/25/2016  . HTN (hypertension), benign 05/25/2016  . Productive cough 05/25/2016    Past Surgical History:  Procedure Laterality Date  . LEFT HEART CATH AND CORONARY ANGIOGRAPHY N/A 05/26/2016   Procedure: Left Heart Cath and Coronary Angiography;  Surgeon: Lorretta Harp, MD;  Location: [facility];  Service: Cardiovascular;  Laterality: N/A;  . NO PAST SURGERIES         Home Medications    Prior to Admission medications   Medication Sig Start Date End Date Taking? Authorizing Provider  aspirin  81 MG chewable tablet Chew 1 tablet (81 mg total) by mouth daily. 05/28/16  Yes Vann, Jessica U, DO  benazepril-hydrochlorthiazide (LOTENSIN HCT) 20-25 MG tablet Take 1 tablet by mouth daily. 02/26/16  Yes [provider]  carvedilol (COREG) 12.5 MG tablet Take 1.5 tablets (18.75 mg total) by mouth 2 (two) times daily. 09/12/16 02/13/17 Yes Burnell Blanks, MD  glimepiride (AMARYL) 2 MG tablet Take 2 mg by mouth 2 (two) times daily. 03/07/16  Yes [provider]  JANUMET 50-1000 MG tablet Take 1 tablet by mouth 2 (two) times daily. 03/07/16  Yes [provider]  lovastatin (MEVACOR) 40 MG tablet Take 40 mg by mouth every evening. 04/21/16  Yes [provider]  Multiple Vitamin (MULTIVITAMIN WITH MINERALS) TABS tablet Take 1 tablet by mouth daily.   Yes [provider]  pioglitazone (ACTOS) 15 MG tablet Take 15 mg by mouth daily. 03/07/16  Yes [provider]  triamcinolone cream (KENALOG) 0.1 % Apply 1 application topically daily as needed (rash).  02/26/16  Yes [provider]    Family History Family History  Problem Relation Age of Onset  . Heart disease Brother 39       CABG  . Heart attack Neg Hx     Social History Social History   Tobacco Use  . Smoking status: Never Smoker  . Smokeless tobacco: Never Used  Substance Use Topics  . Alcohol use: Not on file  .  Drug use: Not on file     Allergies   Tobramycin   Review of Systems Review of Systems  Constitutional: Positive for diaphoresis. Negative for appetite change, chills, fatigue and fever.  HENT: Negative for mouth sores, sore throat and trouble swallowing.   Eyes: Negative for visual disturbance.  Respiratory: Negative for cough, chest tightness, shortness of breath and wheezing.   Cardiovascular: Positive for palpitations. Negative for chest pain.  Gastrointestinal: Negative for abdominal distention, abdominal pain, diarrhea, nausea and vomiting.    Endocrine: Negative for polydipsia, polyphagia and polyuria.  Genitourinary: Negative for dysuria, frequency and hematuria.  Musculoskeletal: Negative for gait problem.  Skin: Negative for color change, pallor and rash.  Neurological: Negative for dizziness, syncope, light-headedness and headaches.  Hematological: Does not bruise/bleed easily.  Psychiatric/Behavioral: Negative for behavioral problems and confusion.     Physical Exam Updated Vital Signs BP 98/64   Pulse 77   Temp (!) 95.1 F (35.1 C) (Oral)   Resp 19   SpO2 95%   Physical Exam  Constitutional: He is oriented to person, place, and time. He appears well-developed and well-nourished. No distress.  HENT:  Head: Normocephalic.  Eyes: Conjunctivae are normal. Pupils are equal, round, and reactive to light. No scleral icterus.  Neck: Normal range of motion. Neck supple. No thyromegaly present.  Cardiovascular: Exam reveals no gallop and no friction rub.  No murmur heard. Regular wide-complex tachycardia. He has good pulses. He is awake and alert. He has clear lungs.  Pulmonary/Chest: Effort normal and breath sounds normal. No respiratory distress. He has no wheezes. He has no rales.  Abdominal: Soft. Bowel sounds are normal. He exhibits no distension. There is no tenderness. There is no rebound.  Musculoskeletal: Normal range of motion.  Neurological: He is alert and oriented to person, place, and time.  Skin: Skin is warm and dry. No rash noted.  Psychiatric: He has a normal mood and affect. His behavior is normal.     ED Treatments / Results  Labs (all labs ordered are listed, but only abnormal results are displayed) Labs Reviewed  CBC WITH DIFFERENTIAL/PLATELET - Abnormal; Notable for the following components:      Result Value   Monocytes Absolute 1.2 (*)    All other components within normal limits  BASIC METABOLIC PANEL - Abnormal; Notable for the following components:   Chloride 99 (*)    CO2 21 (*)     Glucose, Bld 262 (*)    Creatinine, Ser 1.53 (*)    GFR calc non Af Amer 47 (*)    GFR calc Af Amer 54 (*)    All other components within normal limits  MAGNESIUM - Abnormal; Notable for the following components:   Magnesium 1.5 (*)    All other components within normal limits  TROPONIN I    EKG  EKG Interpretation None       Radiology No results found.  Procedures Procedures (including critical care time)  Medications Ordered in ED Medications  adenosine (ADENOCARD) 6 MG/2ML injection (not administered)  magnesium sulfate IVPB 2 g 50 mL (2 g Intravenous New Bag/Given 02/13/17 0843)  adenosine (ADENOCARD) 6 MG/2ML injection 12 mg (12 mg Intravenous Given 02/13/17 0715)     Initial Impression / Assessment and Plan / ED Course  I have reviewed the triage vital signs and the nursing notes.  Pertinent labs & imaging results that were available during my care of the patient were reviewed by me and considered in my medical  decision making (see chart for details).    Patient previously resolved with 4 mg of adenosine. on his most recent admission. He is stable. This is regular. He has pre-existing bundle.  Procedure: Cardioversion. With full cardiopulmonary monitoring and pads placed on patient's chest he was given 12 mg of Adenocard by rapid IV push and is immediate conversion to sinus rhythm.  Potassium is 4. Mag is 1.5. Given 2 g IV mag. Troponin is normal. He is asymptomatic. Not a pain or pressure symptoms and has no known coronary disease. He is appropriate for discharge home. I discussed the case with the Chiropodist at [facility] cardiology. He will receive a phone call regarding an outpatient electrophysiology physician appointment   CRITICAL CARE Performed by: Tanna Furry JOSEPH   Total critical care time: 15 minutes  Critical care time was exclusive of separately billable procedures and treating other patients.  Critical care was necessary to treat or  prevent imminent or life-threatening deterioration.  Critical care was time spent personally by me on the following activities: development of treatment plan with patient and/or surrogate as well as nursing, discussions with consultants, evaluation of patient's response to treatment, examination of patient, obtaining history from patient or surrogate, ordering and performing treatments and interventions, ordering and review of laboratory studies, ordering and review of radiographic studies, pulse oximetry and re-evaluation of patient's condition.  Final Clinical Impressions(s) / ED Diagnoses   Final diagnoses:  SVT (supraventricular tachycardia) [facility])    ED Discharge Orders    None       Tanna Furry, MD 02/13/17 0375    Tanna Furry, MD 02/13/17 1012

## 2017-02-13 NOTE — Discharge Instructions (Signed)
Received a call early next week from [facility] cardiology for an appointment with an electrophysiologist physician.  Return here with any recurrence of your palpitations.

## 2017-02-25 MED FILL — BENAZEPRIL-HCTZ 20-25 MG TA: 20-25 | 90 days supply | Qty: 90 | Fill #1

## 2017-02-26 DIAGNOSIS — Z1211 Encounter for screening for malignant neoplasm of colon: Secondary | ICD-10-CM | POA: Diagnosis not present

## 2017-03-18 ENCOUNTER — Encounter: Payer: Self-pay | Admitting: Internal Medicine

## 2017-03-18 ENCOUNTER — Ambulatory Visit: Payer: 59 | Admitting: Internal Medicine

## 2017-03-18 VITALS — BP 132/80 | HR 75 | Resp 16 | Ht 69.0 in | Wt 260.0 lb

## 2017-03-18 DIAGNOSIS — I471 Supraventricular tachycardia: Secondary | ICD-10-CM

## 2017-03-18 MED FILL — GLIMEPIRIDE 2 MG TABLET: 2 | 90 days supply | Qty: 360 | Fill #1

## 2017-03-18 MED FILL — CARVEDILOL 12.5 MG TABLET: 12.5 | 90 days supply | Qty: 270 | Fill #2

## 2017-03-18 MED FILL — JANUMET 50-1,000 MG TABLET: 50-1000 | 30 days supply | Qty: 60 | Fill #3

## 2017-03-18 MED FILL — PIOGLITAZONE HCL 30 MG TAB: 30 | 90 days supply | Qty: 90 | Fill #1

## 2017-03-18 NOTE — Progress Notes (Signed)
HPI [name] is referred today for evaluation of SVT. He is a pleasant 67 yo man with HTN, SVT who sustained a NSTEMI with his initial episode of SVT about 10 months ago. He has had 2 episodes requiring ER evaluation and IV adenosine. He has baseline RBBB. He has not had syncope but does note chest pressure and sob with the episodes. He has been on medical therapy with coreg which he also takes for HTN. He denies syncope, chest pain or sob at baseline when he is not in SVT.  Allergies  Allergen Reactions  . Tobramycin     Made pinkeye flare up more     Current Outpatient Medications  Medication Sig Dispense Refill  . aspirin 81 MG chewable tablet Chew 1 tablet (81 mg total) by mouth daily.    . benazepril-hydrochlorthiazide (LOTENSIN HCT) 20-25 MG tablet Take 1 tablet by mouth daily.  0  . carvedilol (COREG) 12.5 MG tablet Take 1.5 tablets (18.75 mg total) by mouth 2 (two) times daily. 270 tablet 3  . glimepiride (AMARYL) 2 MG tablet Take 2 mg by mouth 2 (two) times daily.  1  . JANUMET 50-1000 MG tablet Take 1 tablet by mouth 2 (two) times daily.  1  . lovastatin (MEVACOR) 40 MG tablet Take 40 mg by mouth every evening.  1  . Magnesium 250 MG TABS Take 1 tablet by mouth daily.    . Multiple Vitamin (MULTIVITAMIN WITH MINERALS) TABS tablet Take 1 tablet by mouth daily.    . pioglitazone (ACTOS) 15 MG tablet Take 15 mg by mouth daily.  1  . triamcinolone cream (KENALOG) 0.1 % Apply 1 application topically daily as needed (rash).   1   No current facility-administered medications for this visit.      Past Medical History:  Diagnosis Date  . DM (diabetes mellitus) ([place])   . HTN (hypertension)   . SVT (supraventricular tachycardia) ([facility])     ROS:   All systems reviewed and negative except as noted in the HPI.   Past Surgical History:  Procedure Laterality Date  . LEFT HEART CATH AND CORONARY ANGIOGRAPHY N/A 05/26/2016   Procedure: Left Heart Cath and Coronary  Angiography;  Surgeon: Lorretta Harp, MD;  Location: [facility];  Service: Cardiovascular;  Laterality: N/A;  . NO PAST SURGERIES       Family History  Problem Relation Age of Onset  . Heart disease Brother 29       CABG  . Heart attack Neg Hx      Social History   Socioeconomic History  . Marital status: Married    Spouse name: Not on file  . Number of children: Not on file  . Years of education: Not on file  . Highest education level: Not on file  Social Needs  . Financial resource strain: Not on file  . Food insecurity - worry: Not on file  . Food insecurity - inability: Not on file  . Transportation needs - medical: Not on file  . Transportation needs - non-medical: Not on file  Occupational History  . Not on file  Tobacco Use  . Smoking status: Never Smoker  . Smokeless tobacco: Never Used  Substance and Sexual Activity  . Alcohol use: No    Frequency: Never  . Drug use: No  . Sexual activity: No  Other Topics Concern  . Not on file  Social History Narrative  . Not on file  BP 132/80   Pulse 75   Resp 16   Ht 5\' 9"  (1.753 m)   Wt 260 lb (117.9 kg)   SpO2 94%   BMI 38.40 kg/m   Physical Exam:  Well appearing 67 yo man, NAD HEENT: Unremarkable Neck:  6 cm JVD, no thyromegally Lymphatics:  No adenopathy Back:  No CVA tenderness Lungs:  Clear with no wheezes HEART:  Regular rate rhythm, no murmurs, no rubs, no clicks Abd:  soft, positive bowel sounds, no organomegally, no rebound, no guarding Ext:  2 plus pulses, no edema, no cyanosis, no clubbing Skin:  No rashes no nodules Neuro:  CN II through XII intact, motor grossly intact  EKG - NSR with RBBB  Assess/Plan: 1. SVT - I have reviewed the episodes and he has a short RP tachycardia teminated with IV adenosine. I have recommended EP study and catheter ablation. I have reviewed the risks/benefits/goals/expectations of the procedure and he will call us if he wishes to proceed. He is  encouraged to avoid caffeine and ETOH. 2. HTN - he will continue his medical therapy and avoid salty food. 3. Obesity - he is overweight and has been encouraged to lose weight.   Barry Alexander.D.

## 2017-03-18 NOTE — Patient Instructions (Addendum)

## 2017-04-22 MED FILL — JANUMET 50-1,000 MG TABLET: 50-1000 | 30 days supply | Qty: 60 | Fill #4

## 2017-04-22 MED FILL — TRIAMCINOLONE 0.1% CREAM: 0.1 | 15 days supply | Qty: 30 | Fill #0

## 2017-05-15 MED FILL — LOVASTATIN 40 MG TABS: 40 | 90 days supply | Qty: 90 | Fill #0

## 2017-05-21 MED FILL — JANUMET 50-1,000 MG TABLET: 50-1000 | 30 days supply | Qty: 60 | Fill #5

## 2017-06-01 DIAGNOSIS — I2510 Atherosclerotic heart disease of native coronary artery without angina pectoris: Secondary | ICD-10-CM | POA: Diagnosis not present

## 2017-06-01 DIAGNOSIS — E782 Mixed hyperlipidemia: Secondary | ICD-10-CM | POA: Diagnosis not present

## 2017-06-01 DIAGNOSIS — E119 Type 2 diabetes mellitus without complications: Secondary | ICD-10-CM | POA: Diagnosis not present

## 2017-06-01 DIAGNOSIS — I10 Essential (primary) hypertension: Secondary | ICD-10-CM | POA: Diagnosis not present

## 2017-06-01 DIAGNOSIS — Z7984 Long term (current) use of oral hypoglycemic drugs: Secondary | ICD-10-CM | POA: Diagnosis not present

## 2017-06-01 MED FILL — BENAZEPRIL-HCTZ 20-25 MG TA: 20-25 | 90 days supply | Qty: 90 | Fill #0

## 2017-06-01 MED FILL — PIOGLITAZONE HCL 30 MG TAB: 30 | 90 days supply | Qty: 90 | Fill #0

## 2017-06-01 MED FILL — CARVEDILOL 12.5 MG TABLET: 12.5 | 90 days supply | Qty: 270 | Fill #0

## 2017-06-01 MED FILL — GLIMEPIRIDE 2 MG TABLET: 2 | 90 days supply | Qty: 360 | Fill #0

## 2017-06-30 MED FILL — JANUMET 50-1,000 MG TABLET: 50-1000 | 30 days supply | Qty: 60 | Fill #0

## 2017-07-13 DIAGNOSIS — D3132 Benign neoplasm of left choroid: Secondary | ICD-10-CM | POA: Diagnosis not present

## 2017-07-13 DIAGNOSIS — H2513 Age-related nuclear cataract, bilateral: Secondary | ICD-10-CM | POA: Diagnosis not present

## 2017-07-13 DIAGNOSIS — E119 Type 2 diabetes mellitus without complications: Secondary | ICD-10-CM | POA: Diagnosis not present

## 2017-07-13 DIAGNOSIS — Z7984 Long term (current) use of oral hypoglycemic drugs: Secondary | ICD-10-CM | POA: Diagnosis not present

## 2017-08-12 MED FILL — TRIAMCINOLONE 0.1% CREAM: 0.1 | 15 days supply | Qty: 30 | Fill #0

## 2017-08-12 MED FILL — LOVASTATIN 40 MG TABS: 40 | 90 days supply | Qty: 90 | Fill #1

## 2017-08-12 MED FILL — JANUMET 50-1,000 MG TABLET: 50-1000 | 30 days supply | Qty: 60 | Fill #1

## 2017-08-26 DIAGNOSIS — J069 Acute upper respiratory infection, unspecified: Secondary | ICD-10-CM | POA: Diagnosis not present

## 2017-08-26 DIAGNOSIS — H1033 Unspecified acute conjunctivitis, bilateral: Secondary | ICD-10-CM | POA: Diagnosis not present

## 2017-08-31 MED FILL — BENAZEPRIL-HCTZ 20-25 MG TA: 20-25 | 90 days supply | Qty: 90 | Fill #1

## 2017-09-07 MED FILL — CARVEDILOL 12.5 MG TABLET: 12.5 | 90 days supply | Qty: 270 | Fill #1

## 2017-09-07 MED FILL — GLIMEPIRIDE 2 MG TABLET: 2 | 90 days supply | Qty: 360 | Fill #1

## 2017-09-07 MED FILL — JANUMET 50-1,000 MG TABLET: 50-1000 | 30 days supply | Qty: 60 | Fill #2

## 2017-09-18 MED FILL — PIOGLITAZONE HCL 30 MG TAB: 30 | 90 days supply | Qty: 90 | Fill #1

## 2017-10-21 MED FILL — JANUMET 50-1,000 MG TABLET: 50-1000 | 30 days supply | Qty: 60 | Fill #3

## 2017-11-18 MED FILL — JANUMET 50-1,000 MG TABLET: 50-1000 | 30 days supply | Qty: 60 | Fill #4

## 2017-11-18 MED FILL — LOVASTATIN 40 MG TABS: 40 | 90 days supply | Qty: 90 | Fill #0

## 2017-11-26 MED FILL — BENAZEPRIL-HCTZ 20-25 MG TA: 20-25 | 90 days supply | Qty: 90 | Fill #0

## 2017-12-02 DIAGNOSIS — I2510 Atherosclerotic heart disease of native coronary artery without angina pectoris: Secondary | ICD-10-CM | POA: Diagnosis not present

## 2017-12-02 DIAGNOSIS — E1159 Type 2 diabetes mellitus with other circulatory complications: Secondary | ICD-10-CM | POA: Diagnosis not present

## 2017-12-02 DIAGNOSIS — E782 Mixed hyperlipidemia: Secondary | ICD-10-CM | POA: Diagnosis not present

## 2017-12-02 DIAGNOSIS — Z7984 Long term (current) use of oral hypoglycemic drugs: Secondary | ICD-10-CM | POA: Diagnosis not present

## 2017-12-02 DIAGNOSIS — I700 Atherosclerosis of aorta: Secondary | ICD-10-CM | POA: Diagnosis not present

## 2017-12-02 DIAGNOSIS — I10 Essential (primary) hypertension: Secondary | ICD-10-CM | POA: Diagnosis not present

## 2017-12-02 MED FILL — GLIMEPIRIDE 2 MG TABLET: 2 | 90 days supply | Qty: 360 | Fill #0

## 2017-12-02 MED FILL — CARVEDILOL 12.5 MG TABLET: 12.5 | 90 days supply | Qty: 270 | Fill #0

## 2017-12-16 MED FILL — PIOGLITAZONE HCL 30 MG TAB: 30 | 90 days supply | Qty: 90 | Fill #0

## 2017-12-16 MED FILL — JANUMET 50-1,000 MG TABLET: 50-1000 | 30 days supply | Qty: 60 | Fill #5

## 2018-01-20 MED FILL — JANUMET 50-1,000 MG TABLET: 50-1000 | 30 days supply | Qty: 60 | Fill #0

## 2018-01-20 MED FILL — TRIAMCINOLONE 0.1% CREAM: 0.1 | 15 days supply | Qty: 30 | Fill #1

## 2018-02-23 MED FILL — BENAZEPRIL-HCTZ 20-25 MG TA: 20-25 | 90 days supply | Qty: 90 | Fill #0

## 2018-02-23 MED FILL — JANUMET 50-1,000 MG TABLET: 50-1000 | 30 days supply | Qty: 60 | Fill #1

## 2018-02-23 MED FILL — LOVASTATIN 40 MG TABS: 40 | 90 days supply | Qty: 90 | Fill #1

## 2018-03-04 MED FILL — GLIMEPIRIDE 2 MG TABLET: 2 | 90 days supply | Qty: 360 | Fill #1

## 2018-03-04 MED FILL — CARVEDILOL 12.5 MG TABLET: 12.5 | 90 days supply | Qty: 270 | Fill #1

## 2018-03-11 MED FILL — TRIAMCINOLONE 0.1% CREAM: 0.1 | 15 days supply | Qty: 30 | Fill #2

## 2018-03-11 MED FILL — PIOGLITAZONE HCL 30 MG TAB: 30 | 90 days supply | Qty: 90 | Fill #1

## 2018-03-19 MED FILL — JANUMET 50-1,000 MG TABLET: 50-1000 | 30 days supply | Qty: 60 | Fill #2

## 2018-04-30 IMAGING — CT CT CHEST W/O CM
2 of 3 series · 11 of 36 positions shown, 13 images · non-contrast
Comparison: Chest radiograph 05/25/2016

CLINICAL DATA: Productive cough for 3 weeks.  Atrial fibrillation.

EXAM:
CT CHEST WITHOUT CONTRAST
TECHNIQUE: Multidetector CT imaging of the chest was performed following the
standard protocol without IV contrast.

[Series 201: chest without, idose (2) · axial · non-contrast · 0.82mm/px · z∈[-446,-146]mm · 8 of 142 slices shown, 10 images]
[im 11/142  mediastinal]
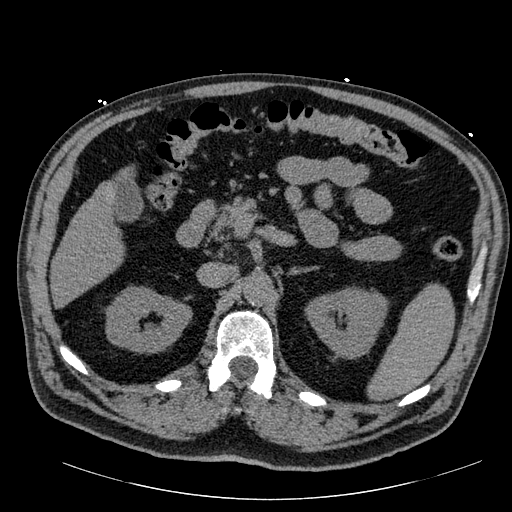
[im 11/142  lung]
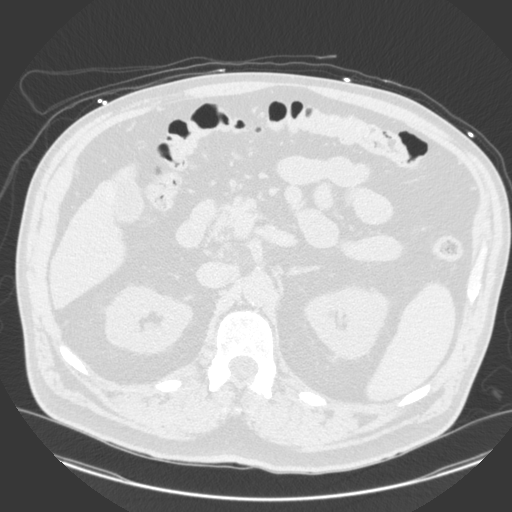
[im 27/142  lung]
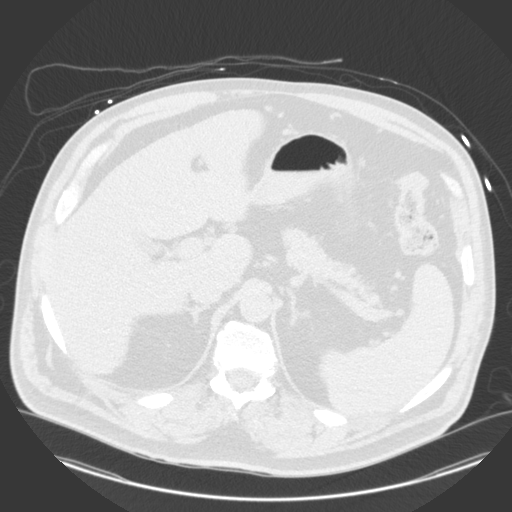
[im 48/142  lung]
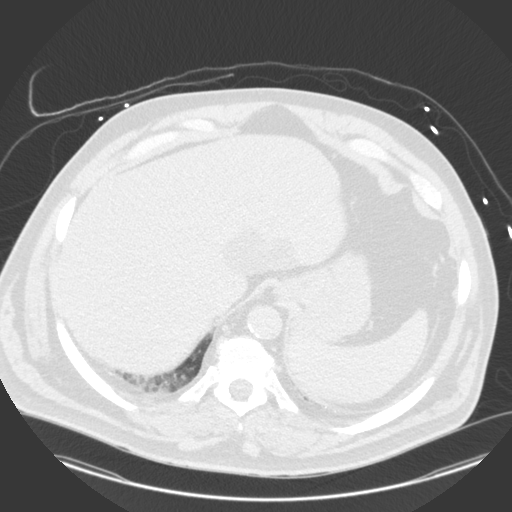
[im 63/142  lung]
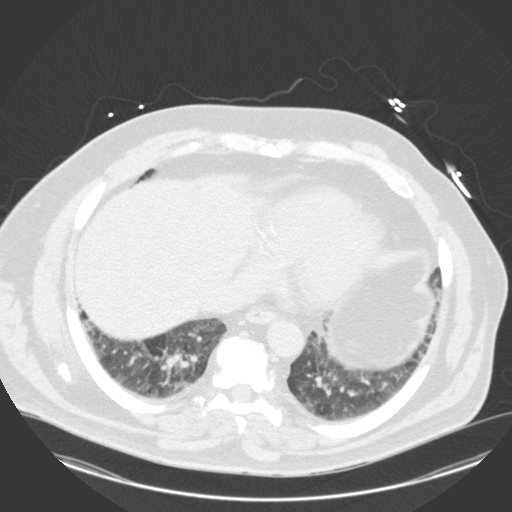
[im 79/142  mediastinal]
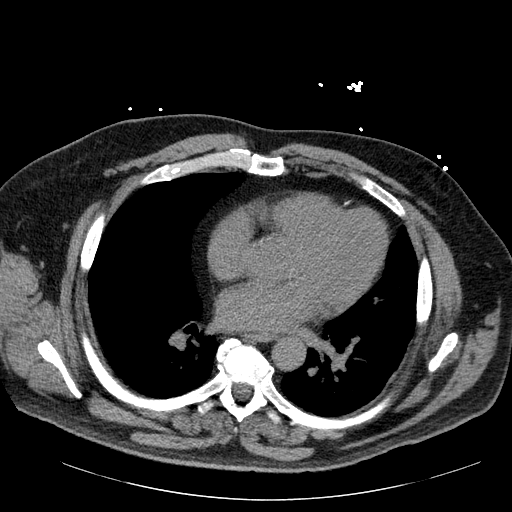
[im 79/142  lung]
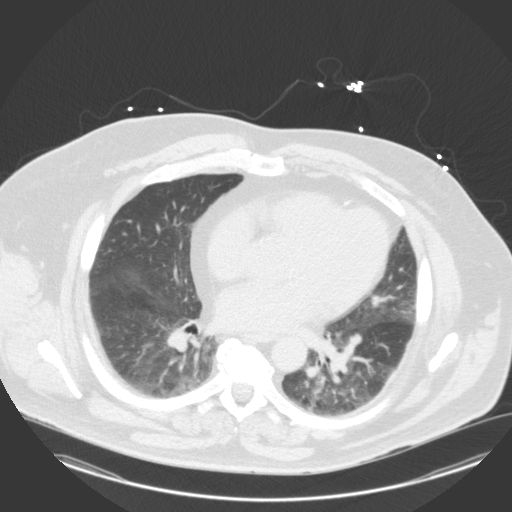
[im 95/142  lung]
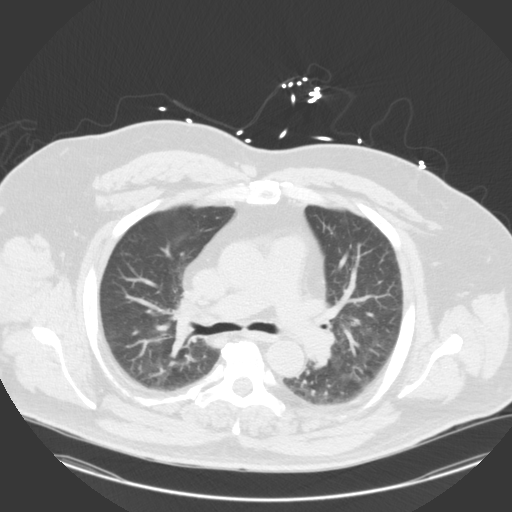
[im 115/142  lung]
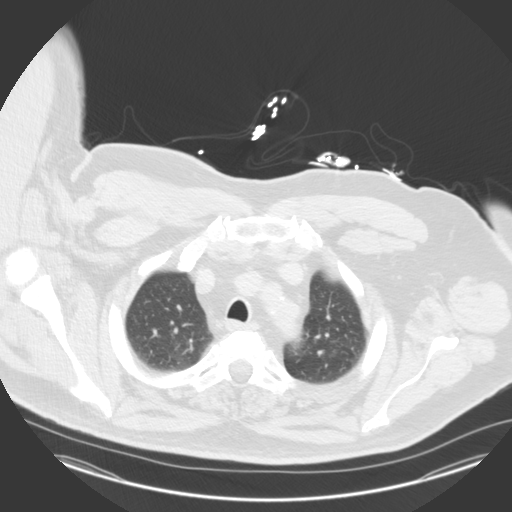
[im 131/142  lung]
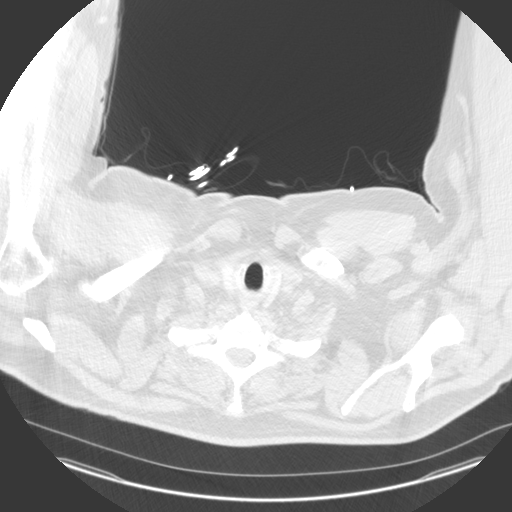

[Series 203: coronal, idose (2) · coronal · 0.45mm/px · 3 of 139 slices shown]
[im 28/139  lung]
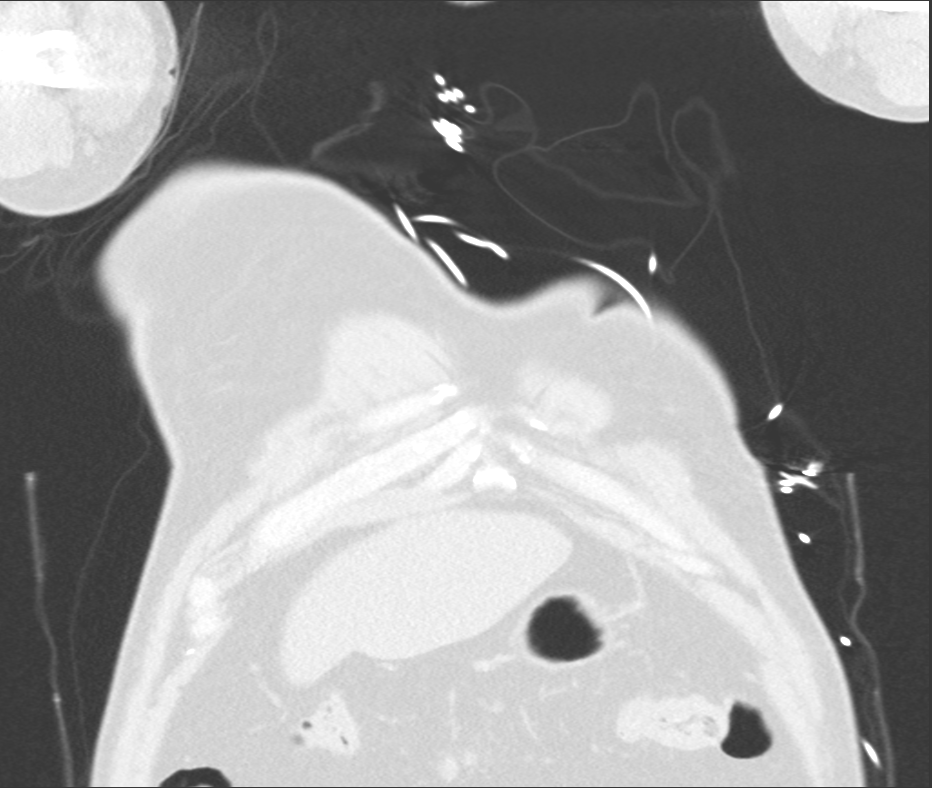
[im 56/139  lung]
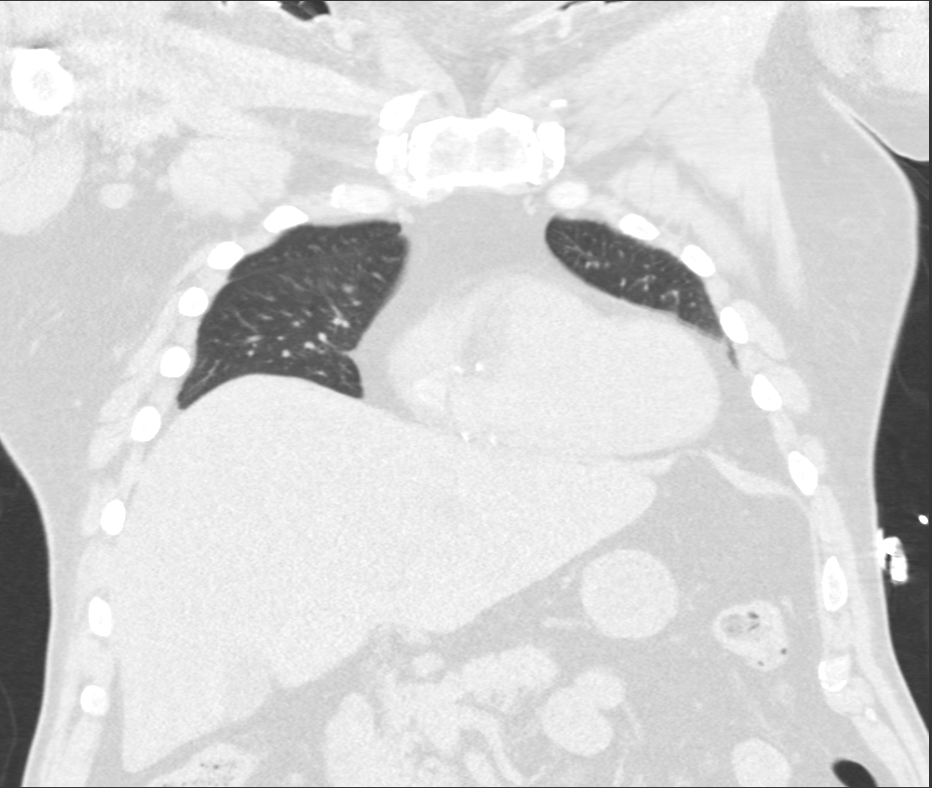
[im 83/139  lung]
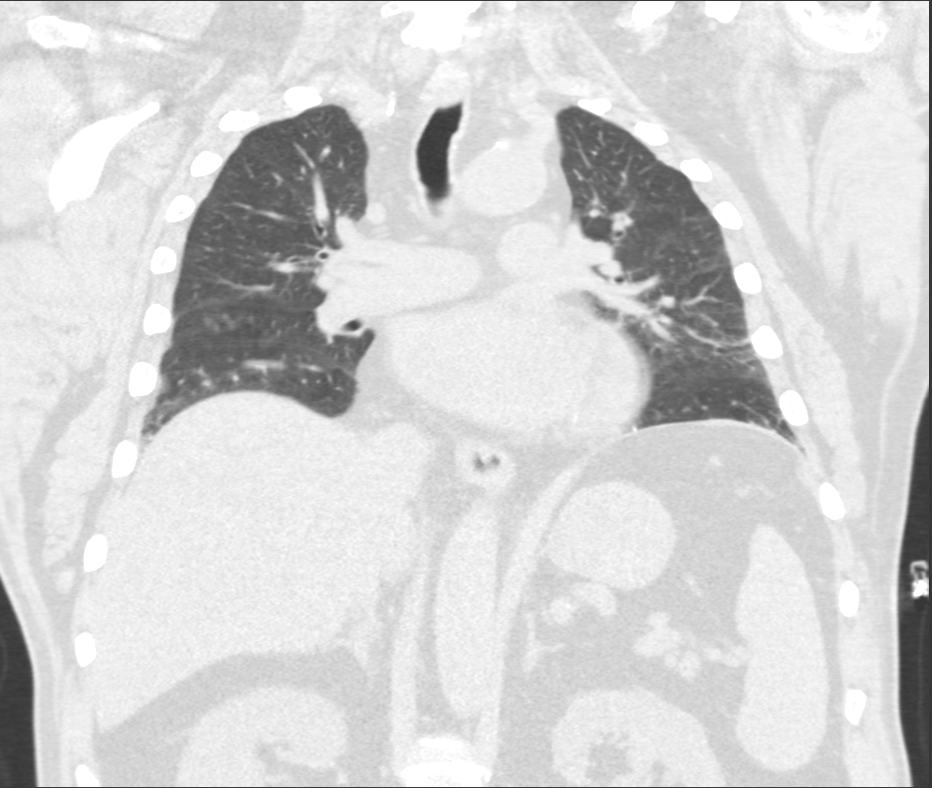

[11 of 36 positions shown; findings below may reference images not displayed]

FINDINGS: Cardiovascular: Normal contour of the great vessels. Advanced
calcific atherosclerotic disease of the coronary arteries. Milder
atherosclerotic disease and tortuosity of the aorta. The heart is
normal in size. There is no pericardial effusion.

Mediastinum/Nodes: No enlarged mediastinal or axillary lymph nodes.
Thyroid gland, trachea, and esophagus demonstrate no significant
findings.

Lungs/Pleura: Lungs are clear. No pleural effusion or pneumothorax.

Upper Abdomen: No acute abnormality. 6 cm water density lobulated
mass in the left lobe of the liver likely represents a cyst. There
is a 2.1 cm circumscribed exophytic mass off of the midpole lateral
left renal cortex, which does not satisfy the criteria for a simple
cyst by density.

Musculoskeletal: Moderate osteoarthritic changes of the thoracic
spine.
IMPRESSION: Advanced calcific atherosclerotic disease of the coronary arteries.

Mild atherosclerotic disease of the aorta.

No evidence of pulmonary consolidation.

2.1 cm left renal mass, indeterminate by a nonenhanced CT criteria.
Further evaluation with renal ultrasound or contrast-enhanced
abdominal CT may be considered if found clinically necessary.

## 2018-06-02 DIAGNOSIS — I10 Essential (primary) hypertension: Secondary | ICD-10-CM | POA: Diagnosis not present

## 2018-06-02 DIAGNOSIS — I700 Atherosclerosis of aorta: Secondary | ICD-10-CM | POA: Diagnosis not present

## 2018-06-02 DIAGNOSIS — E1159 Type 2 diabetes mellitus with other circulatory complications: Secondary | ICD-10-CM | POA: Diagnosis not present

## 2018-06-02 DIAGNOSIS — Z1211 Encounter for screening for malignant neoplasm of colon: Secondary | ICD-10-CM | POA: Diagnosis not present

## 2018-06-02 DIAGNOSIS — E782 Mixed hyperlipidemia: Secondary | ICD-10-CM | POA: Diagnosis not present

## 2018-06-02 DIAGNOSIS — I2510 Atherosclerotic heart disease of native coronary artery without angina pectoris: Secondary | ICD-10-CM | POA: Diagnosis not present

## 2018-06-07 DIAGNOSIS — Z1211 Encounter for screening for malignant neoplasm of colon: Secondary | ICD-10-CM | POA: Diagnosis not present

## 2018-11-25 DIAGNOSIS — E119 Type 2 diabetes mellitus without complications: Secondary | ICD-10-CM | POA: Diagnosis not present

## 2018-11-25 DIAGNOSIS — H2513 Age-related nuclear cataract, bilateral: Secondary | ICD-10-CM | POA: Diagnosis not present

## 2018-11-25 DIAGNOSIS — Z7984 Long term (current) use of oral hypoglycemic drugs: Secondary | ICD-10-CM | POA: Diagnosis not present

## 2018-12-03 DIAGNOSIS — Z23 Encounter for immunization: Secondary | ICD-10-CM | POA: Diagnosis not present

## 2018-12-03 DIAGNOSIS — I10 Essential (primary) hypertension: Secondary | ICD-10-CM | POA: Diagnosis not present

## 2018-12-03 DIAGNOSIS — I700 Atherosclerosis of aorta: Secondary | ICD-10-CM | POA: Diagnosis not present

## 2018-12-03 DIAGNOSIS — E1159 Type 2 diabetes mellitus with other circulatory complications: Secondary | ICD-10-CM | POA: Diagnosis not present

## 2018-12-03 DIAGNOSIS — E782 Mixed hyperlipidemia: Secondary | ICD-10-CM | POA: Diagnosis not present

## 2018-12-03 DIAGNOSIS — Z125 Encounter for screening for malignant neoplasm of prostate: Secondary | ICD-10-CM | POA: Diagnosis not present

## 2018-12-03 DIAGNOSIS — E781 Pure hyperglyceridemia: Secondary | ICD-10-CM | POA: Diagnosis not present

## 2018-12-03 DIAGNOSIS — I2510 Atherosclerotic heart disease of native coronary artery without angina pectoris: Secondary | ICD-10-CM | POA: Diagnosis not present

## 2019-05-26 DIAGNOSIS — E1159 Type 2 diabetes mellitus with other circulatory complications: Secondary | ICD-10-CM | POA: Diagnosis not present

## 2019-05-26 DIAGNOSIS — Z125 Encounter for screening for malignant neoplasm of prostate: Secondary | ICD-10-CM | POA: Diagnosis not present

## 2019-05-26 DIAGNOSIS — I10 Essential (primary) hypertension: Secondary | ICD-10-CM | POA: Diagnosis not present

## 2019-05-26 DIAGNOSIS — Z7984 Long term (current) use of oral hypoglycemic drugs: Secondary | ICD-10-CM | POA: Diagnosis not present

## 2019-05-26 DIAGNOSIS — Z0000 Encounter for general adult medical examination without abnormal findings: Secondary | ICD-10-CM | POA: Diagnosis not present

## 2019-05-26 DIAGNOSIS — R000 Tachycardia, unspecified: Secondary | ICD-10-CM | POA: Diagnosis not present

## 2019-05-26 DIAGNOSIS — E781 Pure hyperglyceridemia: Secondary | ICD-10-CM | POA: Diagnosis not present

## 2019-05-26 DIAGNOSIS — Z6841 Body Mass Index (BMI) 40.0 and over, adult: Secondary | ICD-10-CM | POA: Diagnosis not present

## 2019-09-09 ENCOUNTER — Encounter: Payer: Self-pay | Admitting: Internal Medicine

## 2019-09-09 ENCOUNTER — Other Ambulatory Visit: Payer: Self-pay

## 2019-09-09 ENCOUNTER — Ambulatory Visit: Payer: Medicare HMO | Admitting: Internal Medicine

## 2019-09-09 VITALS — BP 142/78 | HR 65 | Ht 69.0 in | Wt 257.6 lb

## 2019-09-09 DIAGNOSIS — I10 Essential (primary) hypertension: Secondary | ICD-10-CM

## 2019-09-09 DIAGNOSIS — I471 Supraventricular tachycardia, unspecified: Secondary | ICD-10-CM | POA: Insufficient documentation

## 2019-09-09 NOTE — Progress Notes (Signed)
HPI Mr. Gladden is referred today for evaluation of SVT. He is a pleasant 69 yo man with HTN, SVT who sustained a NSTEMI with his initial episode of SVT a couple of years ago. He has had 2 episodes requiring ER evaluation and IV adenosine but none recently. He has baseline RBBB. He has not had syncope but does note chest pressure and sob with the episodes. He has been on medical therapy with coreg which he also takes for HTN. He denies syncope, chest pain or sob at baseline when he is not in SVT. Since I saw him last 2 years ago, he notes he has enjoyed retirement and his heart racing has been fairly well controlled. He takes an extra coreg when his heart races and lies down.  Allergies  Allergen Reactions  . Tobramycin     Made pinkeye flare up more     Current Outpatient Medications  Medication Sig Dispense Refill  . aspirin 81 MG chewable tablet Chew 1 tablet (81 mg total) by mouth daily.    . benazepril-hydrochlorthiazide (LOTENSIN HCT) 20-25 MG tablet Take 1 tablet by mouth daily.  0  . glimepiride (AMARYL) 2 MG tablet Take 2 mg by mouth 2 (two) times daily.  1  . lovastatin (MEVACOR) 40 MG tablet Take 40 mg by mouth every evening.  1  . Magnesium 250 MG TABS Take 1 tablet by mouth daily.    . Multiple Vitamin (MULTIVITAMIN WITH MINERALS) TABS tablet Take 1 tablet by mouth daily.    . pioglitazone (ACTOS) 15 MG tablet Take 15 mg by mouth daily.  1  . triamcinolone cream (KENALOG) 0.1 % Apply 1 application topically daily as needed (rash).   1  . carvedilol (COREG) 12.5 MG tablet Take 1.5 tablets (18.75 mg total) by mouth 2 (two) times daily. 270 tablet 3   No current facility-administered medications for this visit.     Past Medical History:  Diagnosis Date  . DM (diabetes mellitus) ([place])   . HTN (hypertension)   . SVT (supraventricular tachycardia) ([facility])     ROS:   All systems reviewed and negative except as noted in the HPI.   Past Surgical History:  Procedure  Laterality Date  . LEFT HEART CATH AND CORONARY ANGIOGRAPHY N/A 05/26/2016   Procedure: Left Heart Cath and Coronary Angiography;  Surgeon: Lorretta Harp, MD;  Location: [facility];  Service: Cardiovascular;  Laterality: N/A;  . NO PAST SURGERIES       Family History  Problem Relation Age of Onset  . Heart disease Brother 52       CABG  . Heart attack Neg Hx      Social History   Socioeconomic History  . Marital status: Married    Spouse name: Not on file  . Number of children: Not on file  . Years of education: Not on file  . Highest education level: Not on file  Occupational History  . Not on file  Tobacco Use  . Smoking status: Never Smoker  . Smokeless tobacco: Never Used  Vaping Use  . Vaping Use: Never used  Substance and Sexual Activity  . Alcohol use: No  . Drug use: No  . Sexual activity: Never  Other Topics Concern  . Not on file  Social History Narrative  . Not on file   Social Determinants of Health   Financial Resource Strain:   . Difficulty of Paying Living Expenses:   Food Insecurity:   .  Worried About Charity fundraiser in the Last Year:   . Arboriculturist in the Last Year:   Transportation Needs:   . Film/video editor (Medical):   Marland Kitchen Lack of Transportation (Non-Medical):   Physical Activity:   . Days of Exercise per Week:   . Minutes of Exercise per Session:   Stress:   . Feeling of Stress :   Social Connections:   . Frequency of Communication with Friends and Family:   . Frequency of Social Gatherings with Friends and Family:   . Attends Religious Services:   . Active Member of Clubs or Organizations:   . Attends Archivist Meetings:   Marland Kitchen Marital Status:   Intimate Partner Violence:   . Fear of Current or Ex-Partner:   . Emotionally Abused:   Marland Kitchen Physically Abused:   . Sexually Abused:      BP (!) 142/78   Pulse 65   Ht 5\' 9"  (1.753 m)   Wt 257 lb 9.6 oz (116.8 kg)   SpO2 94%   BMI 38.04 kg/m    Physical Exam:  Well appearing NAD HEENT: Unremarkable Neck:  No JVD, no thyromegally Lymphatics:  No adenopathy Back:  No CVA tenderness Lungs:  Clear with no wheezes HEART:  Regular rate rhythm, no murmurs, no rubs, no clicks Abd:  soft, positive bowel sounds, no organomegally, no rebound, no guarding Ext:  2 plus pulses, no edema, no cyanosis, no clubbing Skin:  No rashes no nodules Neuro:  CN II through XII intact, motor grossly intact  EKG - nsr with rbbb   Assess/Plan: 1. SVT - He is well controlled. He will continue his beta blocker and call us if his symptoms worsen. 2. HTN -his bp is better. He is encouraged to lose weight and avoid salty foods. 3. Obesity -I encouraged him to lose weight. 4. Dyslipidemia - he will continue his statin for primary prevention.  [name]

## 2019-09-09 NOTE — Patient Instructions (Addendum)
Medication Instructions:  Your physician recommends that you continue on your current medications as directed. Please refer to the Current Medication list given to you today.  *If you need a refill on your cardiac medications before your next appointment, please call your pharmacy*  Lab Work: None ordered.  If you have labs (blood work) drawn today and your tests are completely normal, you will receive your results only by: Marland Kitchen MyChart Message (if you have MyChart) OR . A paper copy in the mail If you have any lab test that is abnormal or we need to change your treatment, we will call you to review the results.  Testing/Procedures: None ordered.  Follow-Up: At [facility], you and your health needs are our priority.  As part of our continuing mission to provide you with exceptional heart care, we have created designated Provider Care Teams.  These Care Teams include your primary Cardiologist (physician) and Advanced Practice Providers (APPs -  Physician Assistants and Nurse Practitioners) who all work together to provide you with the care you need, when you need it.  We recommend signing up for the patient portal called "MyChart".  Sign up information is provided on this After Visit Summary.  MyChart is used to connect with patients for Virtual Visits (Telemedicine).  Patients are able to view lab/test results, encounter notes, upcoming appointments, etc.  Non-urgent messages can be sent to your provider as well.   To learn more about what you can do with MyChart, go to [email or web address].    Your next appointment:   Your physician wants you to follow-up in: 2 YEARs WITH DR. Lovena Le. You will receive a reminder letter in the mail two months in advance. If you don't receive a letter, please call our office to schedule the follow-up appointment.   Other Instructions:

## 2019-09-25 DIAGNOSIS — E119 Type 2 diabetes mellitus without complications: Secondary | ICD-10-CM | POA: Diagnosis not present

## 2019-09-25 DIAGNOSIS — Z7984 Long term (current) use of oral hypoglycemic drugs: Secondary | ICD-10-CM | POA: Diagnosis not present

## 2019-09-25 DIAGNOSIS — I10 Essential (primary) hypertension: Secondary | ICD-10-CM | POA: Diagnosis not present

## 2019-09-25 DIAGNOSIS — I471 Supraventricular tachycardia: Secondary | ICD-10-CM | POA: Diagnosis not present

## 2019-09-25 DIAGNOSIS — Z79899 Other long term (current) drug therapy: Secondary | ICD-10-CM | POA: Diagnosis not present

## 2019-11-01 ENCOUNTER — Encounter (HOSPITAL_COMMUNITY): Payer: Self-pay | Admitting: *Deleted

## 2019-11-01 ENCOUNTER — Emergency Department (HOSPITAL_COMMUNITY)
Admission: EM | Admit: 2019-11-01 | Discharge: 2019-11-02 | Disposition: A | Discharge status: Home / Self Care | Payer: Medicare HMO | Attending: Emergency Medicine | Admitting: Emergency Medicine

## 2019-11-01 ENCOUNTER — Other Ambulatory Visit: Payer: Self-pay

## 2019-11-01 DIAGNOSIS — Z7984 Long term (current) use of oral hypoglycemic drugs: Secondary | ICD-10-CM | POA: Diagnosis not present

## 2019-11-01 DIAGNOSIS — Z79899 Other long term (current) drug therapy: Secondary | ICD-10-CM | POA: Insufficient documentation

## 2019-11-01 DIAGNOSIS — N289 Disorder of kidney and ureter, unspecified: Secondary | ICD-10-CM

## 2019-11-01 DIAGNOSIS — R000 Tachycardia, unspecified: Secondary | ICD-10-CM | POA: Diagnosis present

## 2019-11-01 DIAGNOSIS — E119 Type 2 diabetes mellitus without complications: Secondary | ICD-10-CM | POA: Diagnosis not present

## 2019-11-01 DIAGNOSIS — I10 Essential (primary) hypertension: Secondary | ICD-10-CM | POA: Diagnosis not present

## 2019-11-01 DIAGNOSIS — I471 Supraventricular tachycardia: Secondary | ICD-10-CM | POA: Diagnosis not present

## 2019-11-01 DIAGNOSIS — Z7982 Long term (current) use of aspirin: Secondary | ICD-10-CM | POA: Diagnosis not present

## 2019-11-01 LAB — CBC
HCT: 46.2 % (ref 39.0–52.0)
Hemoglobin: 15.4 g/dL (ref 13.0–17.0)
MCH: 32.1 pg (ref 26.0–34.0)
MCHC: 33.3 g/dL (ref 30.0–36.0)
MCV: 96.3 fL (ref 80.0–100.0)
Platelets: 156 10*3/uL (ref 150–400)
RBC: 4.8 MIL/uL (ref 4.22–5.81)
RDW: 13.4 % (ref 11.5–15.5)
WBC: 9.3 10*3/uL (ref 4.0–10.5)
nRBC: 0 % (ref 0.0–0.2)

## 2019-11-01 LAB — BASIC METABOLIC PANEL
Anion gap: 13 (ref 5–15)
BUN: 36 mg/dL — ABNORMAL HIGH (ref 8–23)
CO2: 23 mmol/L (ref 22–32)
Calcium: 9.9 mg/dL (ref 8.9–10.3)
Chloride: 102 mmol/L (ref 98–111)
Creatinine, Ser: 1.25 mg/dL — ABNORMAL HIGH (ref 0.61–1.24)
GFR calc Af Amer: >60 mL/min (ref >60)
GFR calc non Af Amer: 60 mL/min — ABNORMAL LOW (ref >60)
Glucose, Bld: 199 mg/dL — ABNORMAL HIGH (ref 70–99)
Potassium: 4 mmol/L (ref 3.5–5.1)
Sodium: 138 mmol/L (ref 135–145)

## 2019-11-01 MED ORDER — ADENOSINE 6 MG/2ML IV SOLN: 6.0000 mg | Freq: Once | INTRAVENOUS | Status: AC

## 2019-11-01 MED ORDER — ADENOSINE 6 MG/2ML IV SOLN
INTRAVENOUS | Status: AC
  Administered 2019-11-01: 6 mg via INTRAVENOUS
  Filled 2019-11-01: qty 6

## 2019-11-01 NOTE — ED Provider Notes (Addendum)
[facility] Provider Note  CSN: [id number] Arrival date & time: 11/01/19 2213    History Chief Complaint  Patient presents with  . Tachycardia    HPI  Barry Alexander is a 69 y.o. male with a history of SVT reports onset of symptoms similar to previous episodes around 7pm today. He felt his heart racing and felt tired, but no CP or SOB. He took a Carvedilol per cardiology recommendations without improvement and so came to the ED for evaluation. He has discussed EP study and ablation with Dr. Lovena Le but has not made the decision to proceed yet.    Past Medical History:  Diagnosis Date  . DM (diabetes mellitus) ([place])   . HTN (hypertension)   . SVT (supraventricular tachycardia) ([facility])     Past Surgical History:  Procedure Laterality Date  . LEFT HEART CATH AND CORONARY ANGIOGRAPHY N/A 05/26/2016   Procedure: Left Heart Cath and Coronary Angiography;  Surgeon: Lorretta Harp, MD;  Location: [facility];  Service: Cardiovascular;  Laterality: N/A;  . NO PAST SURGERIES      Family History  Problem Relation Age of Onset  . Heart disease Brother 41       CABG  . Heart attack Neg Hx     Social History   Tobacco Use  . Smoking status: Never Smoker  . Smokeless tobacco: Never Used  Vaping Use  . Vaping Use: Never used  Substance Use Topics  . Alcohol use: No  . Drug use: No     Home Medications Prior to Admission medications   Medication Sig Start Date End Date Taking? Authorizing Provider  aspirin 81 MG chewable tablet Chew 1 tablet (81 mg total) by mouth daily. 05/28/16   Geradine Girt, DO  benazepril-hydrochlorthiazide (LOTENSIN HCT) 20-25 MG tablet Take 1 tablet by mouth daily. 02/26/16   [provider]  carvedilol (COREG) 12.5 MG tablet Take 1.5 tablets (18.75 mg total) by mouth 2 (two) times daily. 09/12/16 03/18/17  Burnell Blanks, MD  glimepiride (AMARYL) 2 MG tablet Take 2 mg by mouth 2 (two) times daily. 03/07/16    [provider]  lovastatin (MEVACOR) 40 MG tablet Take 40 mg by mouth every evening. 04/21/16   [provider]  Magnesium 250 MG TABS Take 1 tablet by mouth daily.    [provider]  Multiple Vitamin (MULTIVITAMIN WITH MINERALS) TABS tablet Take 1 tablet by mouth daily.    [provider]  pioglitazone (ACTOS) 15 MG tablet Take 15 mg by mouth daily. 03/07/16   [provider]  triamcinolone cream (KENALOG) 0.1 % Apply 1 application topically daily as needed (rash).  02/26/16   [provider]     Allergies    Tobramycin   Review of Systems   Review of Systems A comprehensive review of systems was completed and negative except as noted in HPI.    Physical Exam BP 128/81   Pulse 87   Temp 99.1 F (37.3 C) (Oral)   Resp 17   SpO2 96%   Physical Exam Vitals and nursing note reviewed.  Constitutional:      Appearance: Normal appearance.  HENT:     Head: Normocephalic and atraumatic.     Nose: Nose normal.     Mouth/Throat:     Mouth: Mucous membranes are moist.  Eyes:     Extraocular Movements: Extraocular movements intact.     Conjunctiva/sclera: Conjunctivae normal.  Cardiovascular:  Rate and Rhythm: Regular rhythm. Tachycardia present.  Pulmonary:     Effort: Pulmonary effort is normal.     Breath sounds: Normal breath sounds.  Abdominal:     General: Abdomen is flat.     Palpations: Abdomen is soft.     Tenderness: There is no abdominal tenderness.  Musculoskeletal:        General: No swelling. Normal range of motion.     Cervical back: Neck supple.  Skin:    General: Skin is warm and dry.  Neurological:     General: No focal deficit present.     Mental Status: He is alert.  Psychiatric:        Mood and Affect: Mood normal.      ED Results / Procedures / Treatments   Labs (all labs ordered are listed, but only abnormal results are displayed) Labs Reviewed  BASIC METABOLIC PANEL  CBC     EKG EKG Interpretation  Date/Time:  Tuesday November 01 2019 22:55:55 EDT Ventricular Rate:  90 PR Interval:    QRS Duration: 149 QT Interval:  391 QTC Calculation: 479 R Axis:   -64 Text Interpretation: Sinus rhythm RBBB and LAFB Since last tracing rate faster SVT converted to Sinus Confirmed by Calvert Cantor [id number]) on 11/01/2019 11:00:16 PM   Radiology No results found.  Procedures .Critical Care Performed by: Truddie Hidden, MD Authorized by: Truddie Hidden, MD   Critical care provider statement:    Critical care time (minutes):  35   Critical care was necessary to treat or prevent imminent or life-threatening deterioration of the following conditions:  Cardiac failure   Critical care was time spent personally by me on the following activities:  Discussions with consultants, evaluation of patient's response to treatment, examination of patient, ordering and performing treatments and interventions, ordering and review of laboratory studies, ordering and review of radiographic studies, pulse oximetry, re-evaluation of patient's condition, obtaining history from patient or surrogate and review of old charts    Medications Ordered in the ED Medications  adenosine (ADENOCARD) 6 MG/2ML injection 6 mg (6 mg Intravenous Given 11/01/19 2254)     MDM Rules/Calculators/A&P MDM Number of Diagnoses or Management Options  Patient with history of SVT presents with likely exacerbation of same. He reports he has tried valsalva without improvement. Given Adenosine 6mg  IVP with good response. Reports symptoms improved. CBC, BMP pending, will continue to monitor.   ED Course  I have reviewed the triage vital signs and the nursing notes.  Pertinent labs & imaging results that were available during my care of the patient were reviewed by me and considered in my medical decision making (see chart for details).  Clinical Course as of Oct 31 2309  Tue Nov 01, 2019  2308 Patient  signed out to Dr. Roxanne Mins at the change of shift pending labs and disposition.    [CS]    Clinical Course User Index [CS] Truddie Hidden, MD    Final Clinical Impression(s) / ED Diagnoses Final diagnoses:  SVT (supraventricular tachycardia) [facility])    Rx / DC Orders ED Discharge Orders    None       Truddie Hidden, MD 11/01/19 2311    Truddie Hidden, MD 11/14/19 1034

## 2019-11-01 NOTE — ED Provider Notes (Signed)
Care assumed from Dr. Valere Dross, patient presented with PSVT treated with chemical cardioversion, signed out to me pending labs.  Labs show mild renal insufficiency not changed from baseline.  He is discharged with instructions to follow-up with his cardiologist.  Results for orders placed or performed during the hospital encounter of 56/86/16  Basic metabolic panel  Result Value Ref Range   Sodium 138 135 - 145 mmol/L   Potassium 4.0 3.5 - 5.1 mmol/L   Chloride 102 98 - 111 mmol/L   CO2 23 22 - 32 mmol/L   Glucose, Bld 199 (H) 70 - 99 mg/dL   BUN 36 (H) 8 - 23 mg/dL   Creatinine, Ser 1.25 (H) 0.61 - 1.24 mg/dL   Calcium 9.9 8.9 - 10.3 mg/dL   GFR calc non Af Amer 60 (L) >60 mL/min   GFR calc Af Amer >60 >60 mL/min   Anion gap 13 5 - 15  CBC  Result Value Ref Range   WBC 9.3 4.0 - 10.5 K/uL   RBC 4.80 4.22 - 5.81 MIL/uL   Hemoglobin 15.4 13.0 - 17.0 g/dL   HCT 46.2 39 - 52 %   MCV 96.3 80.0 - 100.0 fL   MCH 32.1 26.0 - 34.0 pg   MCHC 33.3 30.0 - 36.0 g/dL   RDW 13.4 11.5 - 15.5 %   Platelets 156 150 - 400 K/uL   nRBC 0.0 0.0 - 0.2 %     Delora Fuel, MD 83/72/90 2349

## 2019-11-01 NOTE — ED Notes (Signed)
Pt rate improved after admin of 6mg  of adenosine IV w/ MD Karle Starch at bedside. Rate improved from 150s to 80s

## 2019-11-01 NOTE — ED Triage Notes (Addendum)
Pt arrived for tachycardia, hx of the same. Denies pain, reports feeling like he's running a marathon. Took extra dose of carvedilol. Initial VS pulse 150s and bp 47Q systolic. Pt cool to touch

## 2019-11-09 ENCOUNTER — Ambulatory Visit: Payer: Medicare HMO | Admitting: Internal Medicine

## 2019-11-09 ENCOUNTER — Other Ambulatory Visit: Payer: Self-pay

## 2019-11-09 ENCOUNTER — Encounter: Payer: Self-pay | Admitting: Internal Medicine

## 2019-11-09 VITALS — BP 146/74 | HR 65 | Ht 68.5 in | Wt 259.0 lb

## 2019-11-09 DIAGNOSIS — I471 Supraventricular tachycardia: Secondary | ICD-10-CM

## 2019-11-09 NOTE — H&P (View-Only) (Signed)
HPI [name] returns today for followup. 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 He has had a marked increase in his episodes of SVT, with spells lasting for several hours at a time. He has not had syncope. No edema.  Allergies  Allergen Reactions  . Tobramycin     Made pinkeye flare up more     Current Outpatient Medications  Medication Sig Dispense Refill  . aspirin 81 MG chewable tablet Chew 1 tablet (81 mg total) by mouth daily.    . benazepril-hydrochlorthiazide (LOTENSIN HCT) 20-25 MG tablet Take 1 tablet by mouth daily.  0  . CINNAMON PO Take 1,000 mg elemental calcium/kg/hr by mouth daily.    Marland Kitchen glimepiride (AMARYL) 2 MG tablet Take 2 mg by mouth 2 (two) times daily.  1  . lovastatin (MEVACOR) 40 MG tablet Take 40 mg by mouth every evening.  1  . MAGNESIUM PO Take 500 mg by mouth daily.    . Multiple Vitamin (MULTIVITAMIN WITH MINERALS) TABS tablet Take 1 tablet by mouth daily.    . pioglitazone (ACTOS) 15 MG tablet Take 15 mg by mouth daily.  1  . carvedilol (COREG) 12.5 MG tablet Take 1.5 tablets (18.75 mg total) by mouth 2 (two) times daily. 270 tablet 3   No current facility-administered medications for this visit.     Past Medical History:  Diagnosis Date  . DM (diabetes mellitus) ([place])   . HTN (hypertension)   . SVT (supraventricular tachycardia) ([facility])     ROS:   All systems reviewed and negative except as noted in the HPI.   Past Surgical History:  Procedure Laterality Date  . LEFT HEART CATH AND CORONARY ANGIOGRAPHY N/A 05/26/2016   Procedure: Left Heart Cath and Coronary Angiography;  Surgeon: Lorretta Harp, MD;  Location: [facility];  Service: Cardiovascular;  Laterality: N/A;  . NO PAST SURGERIES       Family History  Problem Relation Age of Onset  . Heart disease Brother 42       CABG  . Heart attack Neg Hx      Social History   Socioeconomic History  . Marital status:  Married    Spouse name: Not on file  . Number of children: Not on file  . Years of education: Not on file  . Highest education level: Not on file  Occupational History  . Not on file  Tobacco Use  . Smoking status: Never Smoker  . Smokeless tobacco: Never Used  Vaping Use  . Vaping Use: Never used  Substance and Sexual Activity  . Alcohol use: No  . Drug use: No  . Sexual activity: Never  Other Topics Concern  . Not on file  Social History Narrative  . Not on file   Social Determinants of Health   Financial Resource Strain:   . Difficulty of Paying Living Expenses: Not on file  Food Insecurity:   . Worried About Charity fundraiser in the Last Year: Not on file  . Ran Out of Food in the Last Year: Not on file  Transportation Needs:   . Lack of Transportation (Medical): Not on file  . Lack of Transportation (Non-Medical): Not on file  Physical Activity:   . Days of Exercise per Week: Not on file  . Minutes of Exercise per Session: Not on file  Stress:   . Feeling of Stress :  Not on file  Social Connections:   . Frequency of Communication with Friends and Family: Not on file  . Frequency of Social Gatherings with Friends and Family: Not on file  . Attends Religious Services: Not on file  . Active Member of Clubs or Organizations: Not on file  . Attends Archivist Meetings: Not on file  . Marital Status: Not on file  Intimate Partner Violence:   . Fear of Current or Ex-Partner: Not on file  . Emotionally Abused: Not on file  . Physically Abused: Not on file  . Sexually Abused: Not on file     BP (!) 146/74   Pulse 65   Ht 5' 8.5" (1.74 m)   Wt 259 lb (117.5 kg)   SpO2 95%   BMI 38.81 kg/m   Physical Exam:  Well appearing overweight man, NAD HEENT: Unremarkable Neck:  No JVD, no thyromegally Lymphatics:  No adenopathy Back:  No CVA tenderness Lungs:  Clear with no wheezes HEART:  Regular rate rhythm, no murmurs, no rubs, no clicks Abd:  soft,  positive bowel sounds, no organomegally, no rebound, no guarding Ext:  2 plus pulses, no edema, no cyanosis, no clubbing Skin:  No rashes no nodules Neuro:  CN II through XII intact, motor grossly intact  EKG - reviewed, NSR with no pre-excitation  Assess/Plan: 1. SVT - His symptoms have worsened markedly. I have reviewed the indications/risks/benefits/goals/expectations of catheter ablation and he wishes to proceed. 2. HTN - his bp meds will be adjusted after his ablation. 3. NSTEMI - he minimal luminal irregularities.  4. Obesity - he will need to work on his weight after the procedure.  Barry Overlie Denetta Fei,MD

## 2019-11-09 NOTE — Progress Notes (Signed)
HPI [name] returns today for followup. 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 He has had a marked increase in his episodes of SVT, with spells lasting for several hours at a time. He has not had syncope. No edema.  Allergies  Allergen Reactions  . Tobramycin     Made pinkeye flare up more     Current Outpatient Medications  Medication Sig Dispense Refill  . aspirin 81 MG chewable tablet Chew 1 tablet (81 mg total) by mouth daily.    . benazepril-hydrochlorthiazide (LOTENSIN HCT) 20-25 MG tablet Take 1 tablet by mouth daily.  0  . CINNAMON PO Take 1,000 mg elemental calcium/kg/hr by mouth daily.    Marland Kitchen glimepiride (AMARYL) 2 MG tablet Take 2 mg by mouth 2 (two) times daily.  1  . lovastatin (MEVACOR) 40 MG tablet Take 40 mg by mouth every evening.  1  . MAGNESIUM PO Take 500 mg by mouth daily.    . Multiple Vitamin (MULTIVITAMIN WITH MINERALS) TABS tablet Take 1 tablet by mouth daily.    . pioglitazone (ACTOS) 15 MG tablet Take 15 mg by mouth daily.  1  . carvedilol (COREG) 12.5 MG tablet Take 1.5 tablets (18.75 mg total) by mouth 2 (two) times daily. 270 tablet 3   No current facility-administered medications for this visit.     Past Medical History:  Diagnosis Date  . DM (diabetes mellitus) ([place])   . HTN (hypertension)   . SVT (supraventricular tachycardia) ([facility])     ROS:   All systems reviewed and negative except as noted in the HPI.   Past Surgical History:  Procedure Laterality Date  . LEFT HEART CATH AND CORONARY ANGIOGRAPHY N/A 05/26/2016   Procedure: Left Heart Cath and Coronary Angiography;  Surgeon: Lorretta Harp, MD;  Location: [facility];  Service: Cardiovascular;  Laterality: N/A;  . NO PAST SURGERIES       Family History  Problem Relation Age of Onset  . Heart disease Brother 90       CABG  . Heart attack Neg Hx      Social History   Socioeconomic History  . Marital status:  Married    Spouse name: Not on file  . Number of children: Not on file  . Years of education: Not on file  . Highest education level: Not on file  Occupational History  . Not on file  Tobacco Use  . Smoking status: Never Smoker  . Smokeless tobacco: Never Used  Vaping Use  . Vaping Use: Never used  Substance and Sexual Activity  . Alcohol use: No  . Drug use: No  . Sexual activity: Never  Other Topics Concern  . Not on file  Social History Narrative  . Not on file   Social Determinants of Health   Financial Resource Strain:   . Difficulty of Paying Living Expenses: Not on file  Food Insecurity:   . Worried About Charity fundraiser in the Last Year: Not on file  . Ran Out of Food in the Last Year: Not on file  Transportation Needs:   . Lack of Transportation (Medical): Not on file  . Lack of Transportation (Non-Medical): Not on file  Physical Activity:   . Days of Exercise per Week: Not on file  . Minutes of Exercise per Session: Not on file  Stress:   . Feeling of Stress :  Not on file  Social Connections:   . Frequency of Communication with Friends and Family: Not on file  . Frequency of Social Gatherings with Friends and Family: Not on file  . Attends Religious Services: Not on file  . Active Member of Clubs or Organizations: Not on file  . Attends Archivist Meetings: Not on file  . Marital Status: Not on file  Intimate Partner Violence:   . Fear of Current or Ex-Partner: Not on file  . Emotionally Abused: Not on file  . Physically Abused: Not on file  . Sexually Abused: Not on file     BP (!) 146/74   Pulse 65   Ht 5' 8.5" (1.74 m)   Wt 259 lb (117.5 kg)   SpO2 95%   BMI 38.81 kg/m   Physical Exam:  Well appearing overweight man, NAD HEENT: Unremarkable Neck:  No JVD, no thyromegally Lymphatics:  No adenopathy Back:  No CVA tenderness Lungs:  Clear with no wheezes HEART:  Regular rate rhythm, no murmurs, no rubs, no clicks Abd:  soft,  positive bowel sounds, no organomegally, no rebound, no guarding Ext:  2 plus pulses, no edema, no cyanosis, no clubbing Skin:  No rashes no nodules Neuro:  CN II through XII intact, motor grossly intact  EKG - reviewed, NSR with no pre-excitation  Assess/Plan: 1. SVT - His symptoms have worsened markedly. I have reviewed the indications/risks/benefits/goals/expectations of catheter ablation and he wishes to proceed. 2. HTN - his bp meds will be adjusted after his ablation. 3. NSTEMI - he minimal luminal irregularities.  4. Obesity - he will need to work on his weight after the procedure.  Carleene Overlie Zahrah Sutherlin,MD

## 2019-11-09 NOTE — Patient Instructions (Addendum)
Medication Instructions:  Your physician recommends that you continue on your current medications as directed. Please refer to the Current Medication list given to you today.  Labwork: None ordered.  Testing/Procedures: None ordered.  Follow-Up:  SEE INSTRUCTION LETTER  Any Other Special Instructions Will Be Listed Below (If Applicable).  If you need a refill on your cardiac medications before your next appointment, please call your pharmacy.    Cardiac Ablation Cardiac ablation is a procedure to disable (ablate) a small amount of heart tissue in very specific places. The heart has many electrical connections. Sometimes these connections are abnormal and can cause the heart to beat very fast or irregularly. Ablating some of the problem areas can improve the heart rhythm or return it to normal. Ablation may be done for people who:  Have Wolff-Parkinson-White syndrome.  Have fast heart rhythms (tachycardia).  Have taken medicines for an abnormal heart rhythm (arrhythmia) that were not effective or caused side effects.  Have a high-risk heartbeat that may be life-threatening. During the procedure, a small incision is made in the neck or the groin, and a long, thin, flexible tube (catheter) is inserted into the incision and moved to the heart. Small devices (electrodes) on the tip of the catheter will send out electrical currents. A type of X-ray (fluoroscopy) will be used to help guide the catheter and to provide images of the heart. Tell a health care provider about:  Any allergies you have.  All medicines you are taking, including vitamins, herbs, eye drops, creams, and over-the-counter medicines.  Any problems you or family members have had with anesthetic medicines.  Any blood disorders you have.  Any surgeries you have had.  Any medical conditions you have, such as kidney failure.  Whether you are pregnant or may be pregnant. What are the risks? Generally, this is a  safe procedure. However, problems may occur, including:  Infection.  Bruising and bleeding at the catheter insertion site.  Bleeding into the chest, especially into the sac that surrounds the heart. This is a serious complication.  Stroke or blood clots.  Damage to other structures or organs.  Allergic reaction to medicines or dyes.  Need for a permanent pacemaker if the normal electrical system is damaged. A pacemaker is a small computer that sends electrical signals to the heart and helps your heart beat normally.  The procedure not being fully effective. This may not be recognized until months later. Repeat ablation procedures are sometimes required. What happens before the procedure?  Follow instructions from your health care provider about eating or drinking restrictions.  Ask your health care provider about: ? Changing or stopping your regular medicines. This is especially important if you are taking diabetes medicines or blood thinners. ? Taking medicines such as aspirin and ibuprofen. These medicines can thin your blood. Do not take these medicines before your procedure if your health care provider instructs you not to.  Plan to have someone take you home from the hospital or clinic.  If you will be going home right after the procedure, plan to have someone with you for 24 hours. What happens during the procedure?  To lower your risk of infection: ? Your health care team will wash or sanitize their hands. ? Your skin will be washed with soap. ? Hair may be removed from the incision area.  An IV tube will be inserted into one of your veins.  You will be given a medicine to help you relax (sedative).  The  skin on your neck or groin will be numbed.  An incision will be made in your neck or your groin.  A needle will be inserted through the incision and into a large vein in your neck or groin.  A catheter will be inserted into the needle and moved to your  heart.  Dye may be injected through the catheter to help your surgeon see the area of the heart that needs treatment.  Electrical currents will be sent from the catheter to ablate heart tissue in desired areas. There are three types of energy that may be used to ablate heart tissue: ? Heat (radiofrequency energy). ? Laser energy. ? Extreme cold (cryoablation).  When the necessary tissue has been ablated, the catheter will be removed.  Pressure will be held on the catheter insertion area to prevent excessive bleeding.  A bandage (dressing) will be placed over the catheter insertion area. The procedure may vary among health care providers and hospitals. What happens after the procedure?  Your blood pressure, heart rate, breathing rate, and blood oxygen level will be monitored until the medicines you were given have worn off.  Your catheter insertion area will be monitored for bleeding. You will need to lie still for a few hours to ensure that you do not bleed from the catheter insertion area.  Do not drive for 24 hours or as long as directed by your health care provider. Summary  Cardiac ablation is a procedure to disable (ablate) a small amount of heart tissue in very specific places. Ablating some of the problem areas can improve the heart rhythm or return it to normal.  During the procedure, electrical currents will be sent from the catheter to ablate heart tissue in desired areas. This information is not intended to replace advice given to you by your health care provider. Make sure you discuss any questions you have with your health care provider. Document Revised: 08/17/2017 Document Reviewed: 01/14/2016 Elsevier Patient Education  Barry Alexander.

## 2019-11-18 ENCOUNTER — Other Ambulatory Visit (HOSPITAL_COMMUNITY)
Admission: RE | Admit: 2019-11-18 | Discharge: 2019-11-18 | Disposition: A | Discharge status: Home / Self Care | Payer: Medicare HMO | Source: Ambulatory Visit | Attending: Internal Medicine | Admitting: Internal Medicine

## 2019-11-18 DIAGNOSIS — Z20822 Contact with and (suspected) exposure to covid-19: Secondary | ICD-10-CM | POA: Insufficient documentation

## 2019-11-18 DIAGNOSIS — Z01812 Encounter for preprocedural laboratory examination: Secondary | ICD-10-CM | POA: Diagnosis not present

## 2019-11-18 LAB — SARS CORONAVIRUS 2 (TAT 6-24 HRS): SARS Coronavirus 2: NEGATIVE

## 2019-11-18 NOTE — Progress Notes (Signed)
Attempted to call patient regarding procedure for Monday.  Left followig instruction on voice mail.  Arrival time 0530 Nothing to eat or drink after midnight No meds AM of procedure Responsible person to drive you home and stay with you for 2

## 2019-11-21 ENCOUNTER — Encounter (HOSPITAL_COMMUNITY)
Admission: RE | Disposition: A | Discharge status: Home / Self Care | Payer: Self-pay | Source: Home / Self Care | Attending: Internal Medicine

## 2019-11-21 ENCOUNTER — Ambulatory Visit (HOSPITAL_COMMUNITY)
Admission: RE | Admit: 2019-11-21 | Discharge: 2019-11-21 | Disposition: A | Discharge status: Home / Self Care | Payer: Medicare HMO | Attending: Internal Medicine | Admitting: Internal Medicine

## 2019-11-21 DIAGNOSIS — Z7982 Long term (current) use of aspirin: Secondary | ICD-10-CM | POA: Insufficient documentation

## 2019-11-21 DIAGNOSIS — E669 Obesity, unspecified: Secondary | ICD-10-CM | POA: Insufficient documentation

## 2019-11-21 DIAGNOSIS — I471 Supraventricular tachycardia, unspecified: Secondary | ICD-10-CM | POA: Diagnosis present

## 2019-11-21 DIAGNOSIS — Z6838 Body mass index (BMI) 38.0-38.9, adult: Secondary | ICD-10-CM | POA: Diagnosis not present

## 2019-11-21 DIAGNOSIS — E119 Type 2 diabetes mellitus without complications: Secondary | ICD-10-CM | POA: Insufficient documentation

## 2019-11-21 DIAGNOSIS — Z79899 Other long term (current) drug therapy: Secondary | ICD-10-CM | POA: Insufficient documentation

## 2019-11-21 DIAGNOSIS — Z7984 Long term (current) use of oral hypoglycemic drugs: Secondary | ICD-10-CM | POA: Diagnosis not present

## 2019-11-21 DIAGNOSIS — I10 Essential (primary) hypertension: Secondary | ICD-10-CM | POA: Diagnosis not present

## 2019-11-21 DIAGNOSIS — I252 Old myocardial infarction: Secondary | ICD-10-CM | POA: Insufficient documentation

## 2019-11-21 HISTORY — PX: SVT ABLATION: EP1225

## 2019-11-21 LAB — GLUCOSE, CAPILLARY
Glucose-Capillary: 119 mg/dL — ABNORMAL HIGH (ref 70–99)
Glucose-Capillary: 150 mg/dL — ABNORMAL HIGH (ref 70–99)

## 2019-11-21 SURGERY — SVT ABLATION

## 2019-11-21 MED ORDER — SODIUM CHLORIDE 0.9 % IV SOLN: INTRAVENOUS | Status: DC

## 2019-11-21 MED ORDER — HEPARIN (PORCINE) IN NACL 1000-0.9 UT/500ML-% IV SOLN
INTRAVENOUS | Status: AC
  Filled 2019-11-21: qty 500

## 2019-11-21 MED ORDER — MIDAZOLAM HCL 5 MG/5ML IJ SOLN
INTRAMUSCULAR | Status: AC
  Filled 2019-11-21: qty 5

## 2019-11-21 MED ORDER — MIDAZOLAM HCL 5 MG/5ML IJ SOLN
INTRAMUSCULAR | Status: DC | PRN
  Administered 2019-11-21 (×3): 1 mg via INTRAVENOUS
  Administered 2019-11-21: 2 mg via INTRAVENOUS
  Administered 2019-11-21: 1 mg via INTRAVENOUS

## 2019-11-21 MED ORDER — BUPIVACAINE HCL (PF) 0.25 % IJ SOLN
INTRAMUSCULAR | Status: DC | PRN
  Administered 2019-11-21: 45 mL

## 2019-11-21 MED ORDER — SODIUM CHLORIDE 0.9 % IV SOLN: 250.0000 mL | INTRAVENOUS | Status: DC | PRN

## 2019-11-21 MED ORDER — BUPIVACAINE HCL (PF) 0.25 % IJ SOLN
INTRAMUSCULAR | Status: AC
  Filled 2019-11-21: qty 60

## 2019-11-21 MED ORDER — FENTANYL CITRATE (PF) 100 MCG/2ML IJ SOLN
INTRAMUSCULAR | Status: DC | PRN
Start: 2019-11-21 — End: 2019-11-21
  Administered 2019-11-21: 25 ug via INTRAVENOUS
  Administered 2019-11-21 (×2): 12.5 ug via INTRAVENOUS
  Administered 2019-11-21: 25 ug via INTRAVENOUS

## 2019-11-21 MED ORDER — ONDANSETRON HCL 4 MG/2ML IJ SOLN: 4.0000 mg | Freq: Four times a day (QID) | INTRAMUSCULAR | Status: DC | PRN

## 2019-11-21 MED ORDER — HEPARIN (PORCINE) IN NACL 1000-0.9 UT/500ML-% IV SOLN
INTRAVENOUS | Status: DC | PRN
  Administered 2019-11-21: 500 mL

## 2019-11-21 MED ORDER — FENTANYL CITRATE (PF) 100 MCG/2ML IJ SOLN
INTRAMUSCULAR | Status: AC
Start: 2019-11-21 — End: ?
  Filled 2019-11-21: qty 2

## 2019-11-21 MED ORDER — SODIUM CHLORIDE 0.9% FLUSH: 3.0000 mL | INTRAVENOUS | Status: DC | PRN

## 2019-11-21 MED ORDER — ACETAMINOPHEN 325 MG PO TABS: 650.0000 mg | ORAL_TABLET | ORAL | Status: DC | PRN

## 2019-11-21 SURGICAL SUPPLY — 10 items
BAG SNAP BAND KOVER 36X36 (MISCELLANEOUS) ×2 IMPLANT
CATH CELSIUS THERMO F CV 7FR (ABLATOR) ×2 IMPLANT
CATH HEX JOSEPH 2-5-2 65CM 6F (CATHETERS) ×2 IMPLANT
CATH JOSEPH QUAD ALLRED 6F REP (CATHETERS) ×4 IMPLANT
PACK EP LATEX FREE (CUSTOM PROCEDURE TRAY) ×2
PACK EP LF (CUSTOM PROCEDURE TRAY) ×1 IMPLANT
PAD PRO RADIOLUCENT 2001M-C (PAD) ×2 IMPLANT
SHEATH PINNACLE 6F 10CM (SHEATH) ×4 IMPLANT
SHEATH PINNACLE 7F 10CM (SHEATH) ×2 IMPLANT
SHEATH PINNACLE 8F 10CM (SHEATH) ×2 IMPLANT

## 2019-11-21 NOTE — Progress Notes (Signed)
Site area: rt groin fv sheaths X 3 Site Prior to Removal:  Level 0 Pressure Applied For: 15 minutes Manual:   yes Patient Status During Pull:  stable Post Pull Site:  Level 0 Post Pull Instructions Given:  yes Post Pull Pulses Present: rt dp 3+ palpable Dressing Applied:  Gauze and tegaderm Bedrest begins @ 8375 Comments: IV saline locked

## 2019-11-21 NOTE — Progress Notes (Signed)
Site area: rt IJ venous sheath pulled by Caren Griffins Site Prior to Removal:  Level 0 Pressure Applied For: 10 minutes Manual:   yes Patient Status During Pull:  stable Post Pull Site:  Level 0 Post Pull Instructions Given:  yes Post Pull Pulses Present: NA Dressing Applied:  Petroleum gauze, gauze then tegaderm Bedrest begins @  Comments:

## 2019-11-21 NOTE — Discharge Instructions (Signed)
Post procedure care instructions No driving for 4 days. No lifting over 5 lbs for 1 week. No vigorous or sexual activity for 1 week. You may return to work/your usual activites on 9/20/221. Keep procedure site clean & dry. If you notice increased pain, swelling, bleeding or pus, call/return!  You may shower, but no soaking baths/hot tubs/pools for 1 week.      Cardiac Ablation, Care After  This sheet gives you information about how to care for yourself after your procedure. Your health care provider may also give you more specific instructions. If you have problems or questions, contact your health care provider. What can I expect after the procedure? After the procedure, it is common to have:  Bruising around your puncture site.  Tenderness around your puncture site.  Skipped heartbeats.  Tiredness (fatigue).  Follow these instructions at home: Puncture site care   Follow instructions from your health care provider about how to take care of your puncture site. Make sure you: ? If present, leave stitches (sutures), skin glue, or adhesive strips in place. These skin closures may need to stay in place for up to 2 weeks. If adhesive strip edges start to loosen and curl up, you may trim the loose edges. Do not remove adhesive strips completely unless your health care provider tells you to do that.  Check your puncture site every day for signs of infection. Check for: ? Redness, swelling, or pain. ? Fluid or blood. If your puncture site starts to bleed, lie down on your back, apply firm pressure to the area, and contact your health care provider. ? Warmth. ? Pus or a bad smell. Driving  Do not drive for at least 4 days after your procedure or however long your health care provider recommends. (Do not resume driving if you have previously been instructed not to drive for other health reasons.)  Do not drive or use heavy machinery while taking prescription pain  medicine. Activity  Avoid activities that take a lot of effort for at least 7 days after your procedure.  Do not lift anything that is heavier than 5 lb (4.5 kg) for one week.   No sexual activity for 1 week.   Return to your normal activities as told by your health care provider. Ask your health care provider what activities are safe for you. General instructions  Take over-the-counter and prescription medicines only as told by your health care provider.  Do not use any products that contain nicotine or tobacco, such as cigarettes and e-cigarettes. If you need help quitting, ask your health care provider.  You may shower after 24 hours, but Do not take baths, swim, or use a hot tub for 1 week.   Do not drink alcohol for 24 hours after your procedure.  Keep all follow-up visits as told by your health care provider. This is important. Contact a health care provider if:  You have redness, mild swelling, or pain around your puncture site.  You have fluid or blood coming from your puncture site that stops after applying firm pressure to the area.  Your puncture site feels warm to the touch.  You have pus or a bad smell coming from your puncture site.  You have a fever.  You have chest pain or discomfort that spreads to your neck, jaw, or arm.  You are sweating a lot.  You feel nauseous.  You have a fast or irregular heartbeat.  You have shortness of breath.  You are  dizzy or light-headed and feel the need to lie down.  You have pain or numbness in the arm or leg closest to your puncture site. Get help right away if:  Your puncture site suddenly swells.  Your puncture site is bleeding and the bleeding does not stop after applying firm pressure to the area. These symptoms may represent a serious problem that is an emergency. Do not wait to see if the symptoms will go away. Get medical help right away. Call your local emergency services (911 in the [place] Do not drive  yourself to the hospital. Summary  After the procedure, it is normal to have bruising and tenderness at the puncture site in your groin, neck, or forearm.  Check your puncture site every day for signs of infection.  Get help right away if your puncture site is bleeding and the bleeding does not stop after applying firm pressure to the area. This is a medical emergency. This information is not intended to replace advice given to you by your health care provider. Make sure you discuss any questions you have with your health care provider.

## 2019-11-21 NOTE — Interval H&P Note (Signed)
History and Physical Interval Note:  11/21/2019 7:23 AM  Barry Alexander  has presented today for surgery, with the diagnosis of svt.  The various methods of treatment have been discussed with the patient and family. After consideration of risks, benefits and other options for treatment, the patient has consented to  Procedure(s): SVT ABLATION (N/A) as a surgical intervention.  The patient's history has been reviewed, patient examined, no change in status, stable for surgery.  I have reviewed the patient's chart and labs.  Questions were answered to the patient's satisfaction.     Barry Alexander

## 2019-11-22 ENCOUNTER — Encounter (HOSPITAL_COMMUNITY): Payer: Self-pay | Admitting: Internal Medicine

## 2019-11-25 DIAGNOSIS — E1159 Type 2 diabetes mellitus with other circulatory complications: Secondary | ICD-10-CM | POA: Diagnosis not present

## 2019-11-25 DIAGNOSIS — Z23 Encounter for immunization: Secondary | ICD-10-CM | POA: Diagnosis not present

## 2019-11-25 DIAGNOSIS — L309 Dermatitis, unspecified: Secondary | ICD-10-CM | POA: Diagnosis not present

## 2019-11-25 DIAGNOSIS — E781 Pure hyperglyceridemia: Secondary | ICD-10-CM | POA: Diagnosis not present

## 2019-11-25 DIAGNOSIS — Z7984 Long term (current) use of oral hypoglycemic drugs: Secondary | ICD-10-CM | POA: Diagnosis not present

## 2019-12-27 ENCOUNTER — Other Ambulatory Visit: Payer: Self-pay

## 2019-12-27 ENCOUNTER — Ambulatory Visit: Payer: Medicare HMO | Admitting: Internal Medicine

## 2019-12-27 ENCOUNTER — Encounter: Payer: Self-pay | Admitting: Internal Medicine

## 2019-12-27 VITALS — BP 140/88 | HR 69 | Ht 69.0 in | Wt 252.0 lb

## 2019-12-27 DIAGNOSIS — I471 Supraventricular tachycardia: Secondary | ICD-10-CM

## 2019-12-27 NOTE — Patient Instructions (Addendum)
Medication Instructions:  Your physician recommends that you continue on your current medications as directed. Please refer to the Current Medication list given to you today.  Labwork: None ordered.  Testing/Procedures: None ordered.  Follow-Up: Your physician wants you to follow-up in: as needed with Dr. Taylor.      Any Other Special Instructions Will Be Listed Below (If Applicable).  If you need a refill on your cardiac medications before your next appointment, please call your pharmacy.   

## 2019-12-27 NOTE — Progress Notes (Signed)
HPI Barry Alexander returns today for followup. 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 He has done well in the interim, with no chest pain or sob or symptoms of SVT since his ablation. He denies leg pain.  Allergies  Allergen Reactions  . Tobramycin     Made pinkeye flare up more     Current Outpatient Medications  Medication Sig Dispense Refill  . aspirin 81 MG chewable tablet Chew 1 tablet (81 mg total) by mouth daily.    . benazepril-hydrochlorthiazide (LOTENSIN HCT) 20-25 MG tablet Take 1 tablet by mouth daily.  0  . CINNAMON PO Take 1,000 mg elemental calcium/kg/hr by mouth daily.    Marland Kitchen glimepiride (AMARYL) 2 MG tablet Take 2 mg by mouth 2 (two) times daily.  1  . lovastatin (MEVACOR) 40 MG tablet Take 40 mg by mouth every evening.  1  . MAGNESIUM PO Take 500 mg by mouth daily.    . Multiple Vitamin (MULTIVITAMIN WITH MINERALS) TABS tablet Take 1 tablet by mouth daily.    . pioglitazone (ACTOS) 15 MG tablet Take 15 mg by mouth daily.  1  . carvedilol (COREG) 12.5 MG tablet Take 1.5 tablets (18.75 mg total) by mouth 2 (two) times daily. 270 tablet 3   No current facility-administered medications for this visit.     Past Medical History:  Diagnosis Date  . DM (diabetes mellitus) ([place])   . HTN (hypertension)   . SVT (supraventricular tachycardia) ([facility])     ROS:   All systems reviewed and negative except as noted in the HPI.   Past Surgical History:  Procedure Laterality Date  . LEFT HEART CATH AND CORONARY ANGIOGRAPHY N/A 05/26/2016   Procedure: Left Heart Cath and Coronary Angiography;  Surgeon: Lorretta Harp, MD;  Location: [facility];  Service: Cardiovascular;  Laterality: N/A;  . NO PAST SURGERIES    . SVT ABLATION N/A 11/21/2019   Procedure: SVT ABLATION;  Surgeon: Evans Lance, MD;  Location: [facility];  Service: Cardiovascular;  Laterality: N/A;      Family History  Problem Relation Age of Onset  . Heart disease Brother 23       CABG  . Heart attack Neg Hx      Social History   Socioeconomic History  . Marital status: Married    Spouse name: Not on file  . Number of children: Not on file  . Years of education: Not on file  . Highest education level: Not on file  Occupational History  . Not on file  Tobacco Use  . Smoking status: Never Smoker  . Smokeless tobacco: Never Used  Vaping Use  . Vaping Use: Never used  Substance and Sexual Activity  . Alcohol use: No  . Drug use: No  . Sexual activity: Never  Other Topics Concern  . Not on file  Social History Narrative  . Not on file   Social Determinants of Health   Financial Resource Strain:   . Difficulty of Paying Living Expenses: Not on file  Food Insecurity:   . Worried About Charity fundraiser in the Last Year: Not on file  . Ran Out of Food in the Last Year: Not on file  Transportation Needs:   . Lack of Transportation (Medical): Not on file  . Lack of Transportation (Non-Medical): Not on file  Physical Activity:   . Days of Exercise per Week: Not on file  . Minutes of Exercise per Session: Not on file  Stress:   . Feeling of Stress : Not on file  Social Connections:   . Frequency of Communication with Friends and Family: Not on file  . Frequency of Social Gatherings with Friends and Family: Not on file  . Attends Religious Services: Not on file  . Active Member of Clubs or Organizations: Not on file  . Attends Archivist Meetings: Not on file  . Marital Status: Not on file  Intimate Partner Violence:   . Fear of Current or Ex-Partner: Not on file  . Emotionally Abused: Not on file  . Physically Abused: Not on file  . Sexually Abused: Not on file     BP 140/88   Pulse 69   Ht 5\' 9"  (1.753 m)   Wt 252 lb (114.3 kg)   SpO2 96%   BMI 37.21 kg/m   Physical Exam:  Well appearing NAD HEENT: Unremarkable Neck:  No JVD, no  thyromegally Lymphatics:  No adenopathy Back:  No CVA tenderness Lungs:  Clear with no wheezes HEART:  Regular rate rhythm, no murmurs, no rubs, no clicks Abd:  soft, positive bowel sounds, no organomegally, no rebound, no guarding Ext:  2 plus pulses, no edema, no cyanosis, no clubbing Skin:  No rashes no nodules Neuro:  CN II through XII intact, motor grossly intact  EKG - nsr  Assess/Plan: 1. SVT - he is s/p EP study and catheter ablation. He has had no additional symptoms. He is still on a beta blocker but has HTN. 2. HTN - I considered stopping the coreg but we will hold off for now.  3. Dyslipidemia - he will continue his low dose statin. 4. Prevention - we discussed primary prevention of vascular disease and the need to take daily ASA. I asked him to reduce the dose to 81 mg M/W/F.   Barry Overlie Moroni Nester,MD

## 2020-04-06 DIAGNOSIS — H6090 Unspecified otitis externa, unspecified ear: Secondary | ICD-10-CM | POA: Diagnosis not present

## 2020-04-16 DIAGNOSIS — H9201 Otalgia, right ear: Secondary | ICD-10-CM | POA: Diagnosis not present

## 2020-05-25 DIAGNOSIS — Z6841 Body Mass Index (BMI) 40.0 and over, adult: Secondary | ICD-10-CM | POA: Diagnosis not present

## 2020-05-25 DIAGNOSIS — E119 Type 2 diabetes mellitus without complications: Secondary | ICD-10-CM | POA: Diagnosis not present

## 2020-05-25 DIAGNOSIS — I471 Supraventricular tachycardia: Secondary | ICD-10-CM | POA: Diagnosis not present

## 2020-05-28 DIAGNOSIS — Z125 Encounter for screening for malignant neoplasm of prostate: Secondary | ICD-10-CM | POA: Diagnosis not present

## 2020-05-28 DIAGNOSIS — I10 Essential (primary) hypertension: Secondary | ICD-10-CM | POA: Diagnosis not present

## 2020-05-28 DIAGNOSIS — I2510 Atherosclerotic heart disease of native coronary artery without angina pectoris: Secondary | ICD-10-CM | POA: Diagnosis not present

## 2020-05-28 DIAGNOSIS — Z7984 Long term (current) use of oral hypoglycemic drugs: Secondary | ICD-10-CM | POA: Diagnosis not present

## 2020-05-28 DIAGNOSIS — E782 Mixed hyperlipidemia: Secondary | ICD-10-CM | POA: Diagnosis not present

## 2020-05-28 DIAGNOSIS — I700 Atherosclerosis of aorta: Secondary | ICD-10-CM | POA: Diagnosis not present

## 2020-05-28 DIAGNOSIS — Z0000 Encounter for general adult medical examination without abnormal findings: Secondary | ICD-10-CM | POA: Diagnosis not present

## 2020-05-28 DIAGNOSIS — E1159 Type 2 diabetes mellitus with other circulatory complications: Secondary | ICD-10-CM | POA: Diagnosis not present

## 2020-05-28 DIAGNOSIS — L309 Dermatitis, unspecified: Secondary | ICD-10-CM | POA: Diagnosis not present

## 2020-11-27 DIAGNOSIS — I2510 Atherosclerotic heart disease of native coronary artery without angina pectoris: Secondary | ICD-10-CM | POA: Diagnosis not present

## 2020-11-27 DIAGNOSIS — I10 Essential (primary) hypertension: Secondary | ICD-10-CM | POA: Diagnosis not present

## 2020-11-27 DIAGNOSIS — E1159 Type 2 diabetes mellitus with other circulatory complications: Secondary | ICD-10-CM | POA: Diagnosis not present

## 2020-11-27 DIAGNOSIS — Z7984 Long term (current) use of oral hypoglycemic drugs: Secondary | ICD-10-CM | POA: Diagnosis not present

## 2020-11-27 DIAGNOSIS — E782 Mixed hyperlipidemia: Secondary | ICD-10-CM | POA: Diagnosis not present

## 2020-11-27 DIAGNOSIS — Z23 Encounter for immunization: Secondary | ICD-10-CM | POA: Diagnosis not present

## 2020-11-27 DIAGNOSIS — I700 Atherosclerosis of aorta: Secondary | ICD-10-CM | POA: Diagnosis not present

## 2021-03-14 DIAGNOSIS — E119 Type 2 diabetes mellitus without complications: Secondary | ICD-10-CM | POA: Diagnosis not present

## 2021-03-14 DIAGNOSIS — D492 Neoplasm of unspecified behavior of bone, soft tissue, and skin: Secondary | ICD-10-CM | POA: Diagnosis not present

## 2021-03-18 DIAGNOSIS — Z7984 Long term (current) use of oral hypoglycemic drugs: Secondary | ICD-10-CM | POA: Diagnosis not present

## 2021-03-18 DIAGNOSIS — H2513 Age-related nuclear cataract, bilateral: Secondary | ICD-10-CM | POA: Diagnosis not present

## 2021-05-14 DIAGNOSIS — H2512 Age-related nuclear cataract, left eye: Secondary | ICD-10-CM | POA: Diagnosis not present

## 2021-05-14 DIAGNOSIS — H18413 Arcus senilis, bilateral: Secondary | ICD-10-CM | POA: Diagnosis not present

## 2021-05-14 DIAGNOSIS — H25043 Posterior subcapsular polar age-related cataract, bilateral: Secondary | ICD-10-CM | POA: Diagnosis not present

## 2021-05-14 DIAGNOSIS — H25013 Cortical age-related cataract, bilateral: Secondary | ICD-10-CM | POA: Diagnosis not present

## 2021-05-14 DIAGNOSIS — H2513 Age-related nuclear cataract, bilateral: Secondary | ICD-10-CM | POA: Diagnosis not present

## 2021-05-16 DIAGNOSIS — L570 Actinic keratosis: Secondary | ICD-10-CM | POA: Diagnosis not present

## 2021-05-16 DIAGNOSIS — L720 Epidermal cyst: Secondary | ICD-10-CM | POA: Diagnosis not present

## 2021-05-16 DIAGNOSIS — D485 Neoplasm of uncertain behavior of skin: Secondary | ICD-10-CM | POA: Diagnosis not present

## 2021-05-27 DIAGNOSIS — Z125 Encounter for screening for malignant neoplasm of prostate: Secondary | ICD-10-CM | POA: Diagnosis not present

## 2021-05-27 DIAGNOSIS — E1159 Type 2 diabetes mellitus with other circulatory complications: Secondary | ICD-10-CM | POA: Diagnosis not present

## 2021-05-27 DIAGNOSIS — E782 Mixed hyperlipidemia: Secondary | ICD-10-CM | POA: Diagnosis not present

## 2021-05-27 DIAGNOSIS — Z7984 Long term (current) use of oral hypoglycemic drugs: Secondary | ICD-10-CM | POA: Diagnosis not present

## 2021-05-27 DIAGNOSIS — I10 Essential (primary) hypertension: Secondary | ICD-10-CM | POA: Diagnosis not present

## 2021-05-27 DIAGNOSIS — I700 Atherosclerosis of aorta: Secondary | ICD-10-CM | POA: Diagnosis not present

## 2021-05-27 DIAGNOSIS — I2510 Atherosclerotic heart disease of native coronary artery without angina pectoris: Secondary | ICD-10-CM | POA: Diagnosis not present

## 2021-07-31 DIAGNOSIS — H2513 Age-related nuclear cataract, bilateral: Secondary | ICD-10-CM | POA: Diagnosis not present

## 2021-07-31 DIAGNOSIS — H2512 Age-related nuclear cataract, left eye: Secondary | ICD-10-CM | POA: Diagnosis not present

## 2021-08-01 DIAGNOSIS — H2511 Age-related nuclear cataract, right eye: Secondary | ICD-10-CM | POA: Diagnosis not present

## 2021-08-21 DIAGNOSIS — H2513 Age-related nuclear cataract, bilateral: Secondary | ICD-10-CM | POA: Diagnosis not present

## 2021-08-21 DIAGNOSIS — H2511 Age-related nuclear cataract, right eye: Secondary | ICD-10-CM | POA: Diagnosis not present

## 2021-11-25 DIAGNOSIS — I10 Essential (primary) hypertension: Secondary | ICD-10-CM | POA: Diagnosis not present

## 2021-11-25 DIAGNOSIS — Z23 Encounter for immunization: Secondary | ICD-10-CM | POA: Diagnosis not present

## 2021-11-25 DIAGNOSIS — I2510 Atherosclerotic heart disease of native coronary artery without angina pectoris: Secondary | ICD-10-CM | POA: Diagnosis not present

## 2021-11-25 DIAGNOSIS — E1159 Type 2 diabetes mellitus with other circulatory complications: Secondary | ICD-10-CM | POA: Diagnosis not present

## 2021-11-25 DIAGNOSIS — E782 Mixed hyperlipidemia: Secondary | ICD-10-CM | POA: Diagnosis not present

## 2021-11-25 DIAGNOSIS — I700 Atherosclerosis of aorta: Secondary | ICD-10-CM | POA: Diagnosis not present

## 2021-11-25 DIAGNOSIS — Z7984 Long term (current) use of oral hypoglycemic drugs: Secondary | ICD-10-CM | POA: Diagnosis not present

## 2022-01-02 ENCOUNTER — Encounter: Payer: Self-pay | Admitting: Internal Medicine

## 2022-01-02 ENCOUNTER — Ambulatory Visit: Payer: Medicare HMO | Attending: Internal Medicine | Admitting: Internal Medicine

## 2022-01-02 VITALS — BP 138/78 | HR 65 | Ht 69.0 in | Wt 266.0 lb

## 2022-01-02 DIAGNOSIS — I10 Essential (primary) hypertension: Secondary | ICD-10-CM

## 2022-01-02 DIAGNOSIS — I471 Supraventricular tachycardia, unspecified: Secondary | ICD-10-CM | POA: Diagnosis not present

## 2022-01-02 NOTE — Progress Notes (Signed)
HPI Mr. Querry returns today for followup. He is a pleasant 71 yo man with a h/o recurrent SVT who underwent EP study and catheter ablation of AVNRT a couple of years ago. He has done well in the interim, with no chest pain or sob or symptoms of SVT since his ablation. He denies leg pain. he is active heating his house with firewood. He has not had more SVT or palpitations. He c/o shoulder pain from splitting wood.  Allergies  Allergen Reactions   Tobramycin     Made pinkeye flare up more     Current Outpatient Medications  Medication Sig Dispense Refill   aspirin 81 MG chewable tablet Chew 1 tablet (81 mg total) by mouth daily.     benazepril-hydrochlorthiazide (LOTENSIN HCT) 20-25 MG tablet Take 1 tablet by mouth daily.  0   CINNAMON PO Take 1,000 mg elemental calcium/kg/hr by mouth daily.     glimepiride (AMARYL) 2 MG tablet Take 2 mg by mouth 2 (two) times daily.  1   lovastatin (MEVACOR) 40 MG tablet Take 40 mg by mouth every evening.  1   MAGNESIUM PO Take 500 mg by mouth daily.     Multiple Vitamin (MULTIVITAMIN WITH MINERALS) TABS tablet Take 1 tablet by mouth daily.     pioglitazone (ACTOS) 45 MG tablet Take 45 mg by mouth daily.     carvedilol (COREG) 12.5 MG tablet Take 1.5 tablets (18.75 mg total) by mouth 2 (two) times daily. 270 tablet 3   No current facility-administered medications for this visit.     Past Medical History:  Diagnosis Date   DM (diabetes mellitus) ([place])    HTN (hypertension)    SVT (supraventricular tachycardia)     ROS:   All systems reviewed and negative except as noted in the HPI.   Past Surgical History:  Procedure Laterality Date   LEFT HEART CATH AND CORONARY ANGIOGRAPHY N/A 05/26/2016   Procedure: Left Heart Cath and Coronary Angiography;  Surgeon: Lorretta Harp, MD;  Location: [facility];  Service: Cardiovascular;  Laterality: N/A;   NO PAST SURGERIES     SVT ABLATION N/A 11/21/2019   Procedure: SVT ABLATION;   Surgeon: Evans Lance, MD;  Location: [facility];  Service: Cardiovascular;  Laterality: N/A;     Family History  Problem Relation Age of Onset   Heart disease Brother 44       CABG   Heart attack Neg Hx      Social History   Socioeconomic History   Marital status: Married    Spouse name: Not on file   Number of children: Not on file   Years of education: Not on file   Highest education level: Not on file  Occupational History   Not on file  Tobacco Use   Smoking status: Never   Smokeless tobacco: Never  Vaping Use   Vaping Use: Never used  Substance and Sexual Activity   Alcohol use: No   Drug use: No   Sexual activity: Never  Other Topics Concern   Not on file  Social History Narrative   Not on file   Social Determinants of Health   Financial Resource Strain: Not on file  Food Insecurity: Not on file  Transportation Needs: Not on file  Physical Activity: Not on file  Stress: Not on file  Social Connections: Not on file  Intimate Partner Violence: Not on file     BP  138/78   Pulse 65   Ht '5\' 9"'$  (1.753 m)   Wt 266 lb (120.7 kg)   SpO2 95%   BMI 39.28 kg/m   Physical Exam:  Well appearing NAD HEENT: Unremarkable Neck:  No JVD, no thyromegally Lymphatics:  No adenopathy Back:  No CVA tenderness Lungs:  Clear with no wheezes HEART:  Regular rate rhythm, no murmurs, no rubs, no clicks Abd:  soft, positive bowel sounds, no organomegally, no rebound, no guarding Ext:  2 plus pulses, no edema, no cyanosis, no clubbing Skin:  No rashes no nodules Neuro:  CN II through XII intact, motor grossly intact  EKG - nsr   Assess/Plan:   1. SVT - he is s/p EP study and catheter ablation. He has had no additional symptoms. He is still on a beta blocker but has HTN. 2. HTN - His bp is controlled on coreg. Continue 3. Dyslipidemia - he will continue his low dose statin.   Carleene Overlie Geovanna Simko,MD

## 2022-01-02 NOTE — Patient Instructions (Addendum)
Medication Instructions:  Your physician recommends that you continue on your current medications as directed. Please refer to the Current Medication list given to you today.  *If you need a refill on your cardiac medications before your next appointment, please call your pharmacy*  Lab Work: None ordered.  If you have labs (blood work) drawn today and your tests are completely normal, you will receive your results only by: MyChart Message (if you have MyChart) OR A paper copy in the mail If you have any lab test that is abnormal or we need to change your treatment, we will call you to review the results.  Testing/Procedures: None ordered.  Follow-Up: At [facility], you and your health needs are our priority.  As part of our continuing mission to provide you with exceptional heart care, we have created designated Provider Care Teams.  These Care Teams include your primary Cardiologist (physician) and Advanced Practice Providers (APPs -  Physician Assistants and Nurse Practitioners) who all work together to provide you with the care you need, when you need it.  We recommend signing up for the patient portal called "MyChart".  Sign up information is provided on this After Visit Summary.  MyChart is used to connect with patients for Virtual Visits (Telemedicine).  Patients are able to view lab/test results, encounter notes, upcoming appointments, etc.  Non-urgent messages can be sent to your provider as well.   To learn more about what you can do with MyChart, go to [email or web address].    Your next appointment:   AS NEEDED  The format for your next appointment:   In Person  Provider:   Gregg Taylor, MD{or one of the following Advanced Practice Providers on your designated Care Team:   Renee Ursuy, PA-C Michael "Andy" Tillery, PA-C  Important Information About Sugar        

## 2022-03-31 DIAGNOSIS — D3132 Benign neoplasm of left choroid: Secondary | ICD-10-CM | POA: Diagnosis not present

## 2022-03-31 DIAGNOSIS — E119 Type 2 diabetes mellitus without complications: Secondary | ICD-10-CM | POA: Diagnosis not present

## 2022-03-31 DIAGNOSIS — Z7984 Long term (current) use of oral hypoglycemic drugs: Secondary | ICD-10-CM | POA: Diagnosis not present

## 2022-05-26 DIAGNOSIS — I10 Essential (primary) hypertension: Secondary | ICD-10-CM | POA: Diagnosis not present

## 2022-05-26 DIAGNOSIS — I700 Atherosclerosis of aorta: Secondary | ICD-10-CM | POA: Diagnosis not present

## 2022-05-26 DIAGNOSIS — I2510 Atherosclerotic heart disease of native coronary artery without angina pectoris: Secondary | ICD-10-CM | POA: Diagnosis not present

## 2022-05-26 DIAGNOSIS — Z6841 Body Mass Index (BMI) 40.0 and over, adult: Secondary | ICD-10-CM | POA: Diagnosis not present

## 2022-05-26 DIAGNOSIS — E119 Type 2 diabetes mellitus without complications: Secondary | ICD-10-CM | POA: Diagnosis not present

## 2022-05-26 DIAGNOSIS — E1159 Type 2 diabetes mellitus with other circulatory complications: Secondary | ICD-10-CM | POA: Diagnosis not present

## 2022-05-26 DIAGNOSIS — E782 Mixed hyperlipidemia: Secondary | ICD-10-CM | POA: Diagnosis not present

## 2022-05-26 DIAGNOSIS — Z125 Encounter for screening for malignant neoplasm of prostate: Secondary | ICD-10-CM | POA: Diagnosis not present

## 2022-09-10 DIAGNOSIS — C44622 Squamous cell carcinoma of skin of right upper limb, including shoulder: Secondary | ICD-10-CM | POA: Diagnosis not present

## 2022-09-10 DIAGNOSIS — C44629 Squamous cell carcinoma of skin of left upper limb, including shoulder: Secondary | ICD-10-CM | POA: Diagnosis not present

## 2022-11-19 DIAGNOSIS — I10 Essential (primary) hypertension: Secondary | ICD-10-CM | POA: Diagnosis not present

## 2022-11-19 DIAGNOSIS — Z23 Encounter for immunization: Secondary | ICD-10-CM | POA: Diagnosis not present

## 2022-11-19 DIAGNOSIS — E782 Mixed hyperlipidemia: Secondary | ICD-10-CM | POA: Diagnosis not present

## 2022-11-19 DIAGNOSIS — I2510 Atherosclerotic heart disease of native coronary artery without angina pectoris: Secondary | ICD-10-CM | POA: Diagnosis not present

## 2022-11-19 DIAGNOSIS — E1159 Type 2 diabetes mellitus with other circulatory complications: Secondary | ICD-10-CM | POA: Diagnosis not present

## 2022-11-19 DIAGNOSIS — Z0000 Encounter for general adult medical examination without abnormal findings: Secondary | ICD-10-CM | POA: Diagnosis not present

## 2022-11-19 DIAGNOSIS — E119 Type 2 diabetes mellitus without complications: Secondary | ICD-10-CM | POA: Diagnosis not present

## 2022-11-19 DIAGNOSIS — I700 Atherosclerosis of aorta: Secondary | ICD-10-CM | POA: Diagnosis not present

## 2023-01-21 DIAGNOSIS — L570 Actinic keratosis: Secondary | ICD-10-CM | POA: Diagnosis not present

## 2023-01-21 DIAGNOSIS — C44629 Squamous cell carcinoma of skin of left upper limb, including shoulder: Secondary | ICD-10-CM | POA: Diagnosis not present

## 2023-01-21 DIAGNOSIS — C44619 Basal cell carcinoma of skin of left upper limb, including shoulder: Secondary | ICD-10-CM | POA: Diagnosis not present

## 2023-01-21 DIAGNOSIS — C44622 Squamous cell carcinoma of skin of right upper limb, including shoulder: Secondary | ICD-10-CM | POA: Diagnosis not present

## 2023-04-15 DIAGNOSIS — Z7984 Long term (current) use of oral hypoglycemic drugs: Secondary | ICD-10-CM | POA: Diagnosis not present

## 2023-04-15 DIAGNOSIS — D3132 Benign neoplasm of left choroid: Secondary | ICD-10-CM | POA: Diagnosis not present

## 2023-04-15 DIAGNOSIS — E119 Type 2 diabetes mellitus without complications: Secondary | ICD-10-CM | POA: Diagnosis not present

## 2023-05-21 DIAGNOSIS — I2510 Atherosclerotic heart disease of native coronary artery without angina pectoris: Secondary | ICD-10-CM | POA: Diagnosis not present

## 2023-05-21 DIAGNOSIS — Z125 Encounter for screening for malignant neoplasm of prostate: Secondary | ICD-10-CM | POA: Diagnosis not present

## 2023-05-21 DIAGNOSIS — E1159 Type 2 diabetes mellitus with other circulatory complications: Secondary | ICD-10-CM | POA: Diagnosis not present

## 2023-05-21 DIAGNOSIS — I10 Essential (primary) hypertension: Secondary | ICD-10-CM | POA: Diagnosis not present

## 2023-05-21 DIAGNOSIS — E782 Mixed hyperlipidemia: Secondary | ICD-10-CM | POA: Diagnosis not present

## 2023-07-21 DIAGNOSIS — L821 Other seborrheic keratosis: Secondary | ICD-10-CM | POA: Diagnosis not present

## 2023-07-21 DIAGNOSIS — Z85828 Personal history of other malignant neoplasm of skin: Secondary | ICD-10-CM | POA: Diagnosis not present

## 2023-07-21 DIAGNOSIS — Z129 Encounter for screening for malignant neoplasm, site unspecified: Secondary | ICD-10-CM | POA: Diagnosis not present

## 2023-07-21 DIAGNOSIS — Z859 Personal history of malignant neoplasm, unspecified: Secondary | ICD-10-CM | POA: Diagnosis not present

## 2023-07-21 DIAGNOSIS — L570 Actinic keratosis: Secondary | ICD-10-CM | POA: Diagnosis not present

## 2023-11-26 DIAGNOSIS — I10 Essential (primary) hypertension: Secondary | ICD-10-CM | POA: Diagnosis not present

## 2023-11-26 DIAGNOSIS — Z0000 Encounter for general adult medical examination without abnormal findings: Secondary | ICD-10-CM | POA: Diagnosis not present

## 2023-11-26 DIAGNOSIS — I2510 Atherosclerotic heart disease of native coronary artery without angina pectoris: Secondary | ICD-10-CM | POA: Diagnosis not present

## 2023-11-26 DIAGNOSIS — E782 Mixed hyperlipidemia: Secondary | ICD-10-CM | POA: Diagnosis not present

## 2023-11-26 DIAGNOSIS — Z23 Encounter for immunization: Secondary | ICD-10-CM | POA: Diagnosis not present

## 2023-11-26 DIAGNOSIS — Z6838 Body mass index (BMI) 38.0-38.9, adult: Secondary | ICD-10-CM | POA: Diagnosis not present

## 2023-11-26 DIAGNOSIS — E1159 Type 2 diabetes mellitus with other circulatory complications: Secondary | ICD-10-CM | POA: Diagnosis not present

## 2023-11-26 DIAGNOSIS — Z1331 Encounter for screening for depression: Secondary | ICD-10-CM | POA: Diagnosis not present

## 2024-01-30 DIAGNOSIS — I4510 Unspecified right bundle-branch block: Secondary | ICD-10-CM | POA: Diagnosis not present

## 2024-01-30 DIAGNOSIS — R059 Cough, unspecified: Secondary | ICD-10-CM | POA: Diagnosis not present

## 2024-01-30 DIAGNOSIS — I4892 Unspecified atrial flutter: Secondary | ICD-10-CM | POA: Diagnosis not present

## 2024-01-30 DIAGNOSIS — Z87891 Personal history of nicotine dependence: Secondary | ICD-10-CM | POA: Diagnosis not present

## 2024-01-30 DIAGNOSIS — R7989 Other specified abnormal findings of blood chemistry: Secondary | ICD-10-CM | POA: Diagnosis not present

## 2024-01-31 DIAGNOSIS — I442 Atrioventricular block, complete: Secondary | ICD-10-CM | POA: Diagnosis not present

## 2024-02-01 DIAGNOSIS — I442 Atrioventricular block, complete: Secondary | ICD-10-CM | POA: Diagnosis not present

## 2024-02-02 DIAGNOSIS — I214 Non-ST elevation (NSTEMI) myocardial infarction: Secondary | ICD-10-CM | POA: Diagnosis not present

## 2024-02-02 DIAGNOSIS — R0989 Other specified symptoms and signs involving the circulatory and respiratory systems: Secondary | ICD-10-CM | POA: Diagnosis not present

## 2024-02-02 DIAGNOSIS — R079 Chest pain, unspecified: Secondary | ICD-10-CM | POA: Diagnosis not present

## 2024-02-02 DIAGNOSIS — Z01818 Encounter for other preprocedural examination: Secondary | ICD-10-CM | POA: Diagnosis not present

## 2024-02-03 DIAGNOSIS — I214 Non-ST elevation (NSTEMI) myocardial infarction: Secondary | ICD-10-CM | POA: Diagnosis not present

## 2024-02-03 DIAGNOSIS — Z951 Presence of aortocoronary bypass graft: Secondary | ICD-10-CM | POA: Diagnosis not present

## 2024-02-03 DIAGNOSIS — I442 Atrioventricular block, complete: Secondary | ICD-10-CM | POA: Diagnosis not present

## 2024-02-03 DIAGNOSIS — J9690 Respiratory failure, unspecified, unspecified whether with hypoxia or hypercapnia: Secondary | ICD-10-CM | POA: Diagnosis not present

## 2024-02-03 DIAGNOSIS — J90 Pleural effusion, not elsewhere classified: Secondary | ICD-10-CM | POA: Diagnosis not present

## 2024-02-04 DIAGNOSIS — Z951 Presence of aortocoronary bypass graft: Secondary | ICD-10-CM | POA: Diagnosis not present

## 2024-02-04 DIAGNOSIS — J9690 Respiratory failure, unspecified, unspecified whether with hypoxia or hypercapnia: Secondary | ICD-10-CM | POA: Diagnosis not present

## 2024-02-04 DIAGNOSIS — I214 Non-ST elevation (NSTEMI) myocardial infarction: Secondary | ICD-10-CM | POA: Diagnosis not present

## 2024-02-05 DIAGNOSIS — J9690 Respiratory failure, unspecified, unspecified whether with hypoxia or hypercapnia: Secondary | ICD-10-CM | POA: Diagnosis not present

## 2024-02-05 DIAGNOSIS — I214 Non-ST elevation (NSTEMI) myocardial infarction: Secondary | ICD-10-CM | POA: Diagnosis not present

## 2024-02-05 DIAGNOSIS — Z951 Presence of aortocoronary bypass graft: Secondary | ICD-10-CM | POA: Diagnosis not present

## 2024-02-06 DIAGNOSIS — Z951 Presence of aortocoronary bypass graft: Secondary | ICD-10-CM | POA: Diagnosis not present

## 2024-02-06 DIAGNOSIS — I214 Non-ST elevation (NSTEMI) myocardial infarction: Secondary | ICD-10-CM | POA: Diagnosis not present

## 2024-02-06 DIAGNOSIS — J9690 Respiratory failure, unspecified, unspecified whether with hypoxia or hypercapnia: Secondary | ICD-10-CM | POA: Diagnosis not present

## 2024-02-07 DIAGNOSIS — I442 Atrioventricular block, complete: Secondary | ICD-10-CM | POA: Diagnosis not present

## 2024-02-08 DIAGNOSIS — I214 Non-ST elevation (NSTEMI) myocardial infarction: Secondary | ICD-10-CM | POA: Diagnosis not present

## 2024-02-08 DIAGNOSIS — I4430 Unspecified atrioventricular block: Secondary | ICD-10-CM | POA: Diagnosis not present

## 2024-02-08 DIAGNOSIS — Z951 Presence of aortocoronary bypass graft: Secondary | ICD-10-CM | POA: Diagnosis not present

## 2024-02-08 DIAGNOSIS — I442 Atrioventricular block, complete: Secondary | ICD-10-CM | POA: Diagnosis not present

## 2024-02-08 DIAGNOSIS — J9690 Respiratory failure, unspecified, unspecified whether with hypoxia or hypercapnia: Secondary | ICD-10-CM | POA: Diagnosis not present

## 2024-02-09 DIAGNOSIS — I442 Atrioventricular block, complete: Secondary | ICD-10-CM | POA: Diagnosis not present

## 2024-02-09 DIAGNOSIS — I214 Non-ST elevation (NSTEMI) myocardial infarction: Secondary | ICD-10-CM | POA: Diagnosis not present

## 2024-02-09 DIAGNOSIS — I4430 Unspecified atrioventricular block: Secondary | ICD-10-CM | POA: Diagnosis not present

## 2024-02-09 DIAGNOSIS — Z950 Presence of cardiac pacemaker: Secondary | ICD-10-CM | POA: Diagnosis not present

## 2024-02-09 DIAGNOSIS — E119 Type 2 diabetes mellitus without complications: Secondary | ICD-10-CM | POA: Diagnosis not present

## 2024-02-09 DIAGNOSIS — Z951 Presence of aortocoronary bypass graft: Secondary | ICD-10-CM | POA: Diagnosis not present

## 2024-02-10 DIAGNOSIS — E1159 Type 2 diabetes mellitus with other circulatory complications: Secondary | ICD-10-CM | POA: Diagnosis not present

## 2024-02-10 DIAGNOSIS — I442 Atrioventricular block, complete: Secondary | ICD-10-CM | POA: Diagnosis not present

## 2024-02-10 DIAGNOSIS — I214 Non-ST elevation (NSTEMI) myocardial infarction: Secondary | ICD-10-CM | POA: Diagnosis not present

## 2024-02-10 DIAGNOSIS — Z951 Presence of aortocoronary bypass graft: Secondary | ICD-10-CM | POA: Diagnosis not present

## 2024-02-10 DIAGNOSIS — Z950 Presence of cardiac pacemaker: Secondary | ICD-10-CM | POA: Diagnosis not present

## 2024-02-10 DIAGNOSIS — I4430 Unspecified atrioventricular block: Secondary | ICD-10-CM | POA: Diagnosis not present

## 2024-02-11 DIAGNOSIS — I4430 Unspecified atrioventricular block: Secondary | ICD-10-CM | POA: Diagnosis not present

## 2024-02-11 DIAGNOSIS — Z951 Presence of aortocoronary bypass graft: Secondary | ICD-10-CM | POA: Diagnosis not present

## 2024-02-11 DIAGNOSIS — E1159 Type 2 diabetes mellitus with other circulatory complications: Secondary | ICD-10-CM | POA: Diagnosis not present

## 2024-02-11 DIAGNOSIS — I214 Non-ST elevation (NSTEMI) myocardial infarction: Secondary | ICD-10-CM | POA: Diagnosis not present

## 2024-02-11 DIAGNOSIS — Z950 Presence of cardiac pacemaker: Secondary | ICD-10-CM | POA: Diagnosis not present

## 2024-02-12 DIAGNOSIS — I4430 Unspecified atrioventricular block: Secondary | ICD-10-CM | POA: Diagnosis not present

## 2024-02-12 DIAGNOSIS — I442 Atrioventricular block, complete: Secondary | ICD-10-CM | POA: Diagnosis not present

## 2024-02-12 DIAGNOSIS — E1159 Type 2 diabetes mellitus with other circulatory complications: Secondary | ICD-10-CM | POA: Diagnosis not present

## 2024-02-12 DIAGNOSIS — Z951 Presence of aortocoronary bypass graft: Secondary | ICD-10-CM | POA: Diagnosis not present

## 2024-02-12 DIAGNOSIS — Z950 Presence of cardiac pacemaker: Secondary | ICD-10-CM | POA: Diagnosis not present

## 2024-02-12 DIAGNOSIS — I214 Non-ST elevation (NSTEMI) myocardial infarction: Secondary | ICD-10-CM | POA: Diagnosis not present
